# Patient Record
Sex: Female | Born: 1975 | Race: Asian | Hispanic: No | Marital: Married | Smoking: Never smoker
Health system: Southern US, Community
[De-identification: ages and names within clinical notes are randomized; demographics above are authoritative.]

## PROBLEM LIST (undated history)

## (undated) DIAGNOSIS — R000 Tachycardia, unspecified: Secondary | ICD-10-CM

## (undated) DIAGNOSIS — K219 Gastro-esophageal reflux disease without esophagitis: Secondary | ICD-10-CM

## (undated) DIAGNOSIS — F419 Anxiety disorder, unspecified: Secondary | ICD-10-CM

---

## 2010-07-27 ENCOUNTER — Emergency Department (HOSPITAL_COMMUNITY): Admission: EM | Admit: 2010-07-27 | Discharge: 2010-07-27 | Payer: Self-pay | Admitting: Family Medicine

## 2016-11-26 ENCOUNTER — Encounter: Payer: Self-pay | Admitting: Family Medicine

## 2016-11-26 ENCOUNTER — Ambulatory Visit (INDEPENDENT_AMBULATORY_CARE_PROVIDER_SITE_OTHER): Payer: 59 | Admitting: Family Medicine

## 2016-11-26 DIAGNOSIS — G5601 Carpal tunnel syndrome, right upper limb: Secondary | ICD-10-CM | POA: Diagnosis not present

## 2016-11-26 MED ORDER — PREDNISONE 10 MG PO TABS: ORAL_TABLET | ORAL | 0 refills | Status: DC

## 2016-11-26 NOTE — Patient Instructions (Signed)
You have carpal tunnel syndrome. Wear the wrist brace at nighttime and as often as possible during the day Prednisone dose pack - take as directed until finished. Day AFTER finishing this you can take ibuprofen 600mg  three times a day with food OR aleve 2 tabs twice a day with food for pain and inflammation but only if needed. Corticosteroid injection is a consideration to help with pain and inflammation If not improving, will consider nerve conduction studies to assess severity. Follow up with me in 6 weeks.

## 2016-11-28 DIAGNOSIS — G5603 Carpal tunnel syndrome, bilateral upper limbs: Secondary | ICD-10-CM | POA: Insufficient documentation

## 2016-11-28 NOTE — Assessment & Plan Note (Signed)
Provided wrist brace to wear at night and as often as possible during the day.  Repeat prednisone dose pack.  Consider injection, NCV/EMGs if not improving as expected.  F/u in 6 weeks.

## 2016-11-28 NOTE — Progress Notes (Signed)
PCP: Charlette CaffeyParuchuri, Lakshmi P, MD  Subjective:   HPI: Patient is a [age] y.o. female here for right wrist pain.  Patient reports she's had at least a couple weeks, up to a month of pain in right wrist. Worse first thing in the morning at 9/10 level, sharp. Associated numbness into hand and fingers but sparing the thumb. No injury or trauma. No swelling. 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 Using a sleeve for wrist but not brace. No skin changes. Right handed.  No past medical history on file.  No current outpatient prescriptions on file prior to visit.   No current facility-administered medications on file prior to visit.     No past surgical history on file.  No Known Allergies  Social History   Social History  . Marital status: Married    Spouse name: N/A  . Number of children: N/A  . Years of education: N/A   Occupational History  . Not on file.   Social History Main Topics  . Smoking status: Never Smoker  . Smokeless tobacco: Never Used  . Alcohol use Not on file  . Drug use: Unknown  . Sexual activity: Not on file   Other Topics Concern  . Not on file   Social History Narrative  . No narrative on file    No family history on file.  BP 129/87   Pulse 84   Ht 5\' 2"  (1.575 m)   Wt 155 lb (70.3 kg)   BMI 28.35 kg/m   Review of Systems: See HPI above.     Objective:  Physical Exam:  Gen: NAD, comfortable in exam room  Right wrist: No gross deformity, swelling, bruising. Mild TTP carpal tunnel.  No other tenderness. FROM digits and wrists. Strength 5/5 all motions including wrist flexion/extension, finger abduction, extension, thumb opposition. Negative phalens.  Positive tinels mildly. Sensation diminished 2nd-5th digits.   Assessment & Plan:  1. Right hand numbness, wrist pain - consistent with carpal tunnel syndrome.  Provided wrist brace to wear at night and as often as possible during the day.  Repeat  prednisone dose pack.  Consider injection, NCV/EMGs if not improving as expected.  F/u in 6 weeks.

## 2017-04-28 ENCOUNTER — Ambulatory Visit (INDEPENDENT_AMBULATORY_CARE_PROVIDER_SITE_OTHER): Payer: 59 | Admitting: Family Medicine

## 2017-04-28 ENCOUNTER — Encounter: Payer: Self-pay | Admitting: Family Medicine

## 2017-04-28 DIAGNOSIS — G5603 Carpal tunnel syndrome, bilateral upper limbs: Secondary | ICD-10-CM | POA: Diagnosis not present

## 2017-04-28 DIAGNOSIS — M722 Plantar fascial fibromatosis: Secondary | ICD-10-CM | POA: Diagnosis not present

## 2017-04-28 MED ORDER — MELOXICAM 7.5 MG PO TABS: 7.5000 mg | ORAL_TABLET | Freq: Every day | ORAL | 2 refills | Status: DC

## 2017-04-28 NOTE — Patient Instructions (Signed)
You have plantar fasciitis Plantar fascia stretch for 20-30 seconds (do 3 of these) in morning Lowering/raise on a step exercises 3 x 10 once or twice a day - this is very important for long term recovery. Can add heel walks, toe walks forward and backward as well Ice heel for 15 minutes as needed. Avoid flat shoes/barefoot walking as much as possible. Arch straps have been shown to help with pain. Inserts are important (dr. Jari Sportsmanscholls active series, spencos, our green insoles, custom orthotics). Steroid injection is a consideration for short term pain relief if you are struggling. Physical therapy is also an option. Follow up with me in 6 weeks.  You have carpal tunnel syndrome. Wear the wrist brace at nighttime and as often as possible during the day - you can find these at a pharmacy if the ones we gave you are too uncomfortable. Meloxicam 7.5mg  daily with food. Corticosteroid injection is a consideration to help with pain and inflammation. We will go ahead with nerve conduction studies of both arms. I will call you with results and next steps.

## 2017-04-29 DIAGNOSIS — M722 Plantar fascial fibromatosis: Secondary | ICD-10-CM | POA: Insufficient documentation

## 2017-04-29 NOTE — Assessment & Plan Note (Signed)
had mild improvement with prednisone dose pack and wrist brace but seems to have recurred.  We discussed options - she would like to go ahead with NCV/EMGs both upper extremities so will set this up.  Continue using wrist braces, try meloxicam also.

## 2017-04-29 NOTE — Progress Notes (Addendum)
PCP: Charlette CaffeyParuchuri, Lakshmi P, MD  Subjective:   HPI: Patient is a 41 y.o. female here for right wrist pain.  3/8: Patient reports she's had at least a couple weeks, up to a month of pain in right wrist. Worse first thing in the morning at 9/10 level, sharp. Associated numbness into hand and fingers but sparing the thumb. No injury or trauma. No swelling. 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 Using a sleeve for wrist but not brace. No skin changes. Right handed.  8/8: Patient reports he right wrist pain improved following prednisone dose pack and wrist brace but has recurred and is now occurring off and on. Pain 3/10 gets pain into 2nd-4th digits along with numbness. Currently doing well. Worse with typing. Also reports pain plantar left heel for past month. Pain is 3/10, sharp and hard to get going in the morning. Benefit with brooks shoes but not with plantar fascia stocking. No skin changes, numbness.  No past medical history on file.  Current Outpatient Prescriptions on File Prior to Visit  Medication Sig Dispense Refill  . atenolol (TENORMIN) 25 MG tablet Take 25 mg by mouth.     No current facility-administered medications on file prior to visit.     No past surgical history on file.  No Known Allergies  Social History   Social History  . Marital status: Married    Spouse name: N/A  . Number of children: N/A  . Years of education: N/A   Occupational History  . Not on file.   Social History Main Topics  . Smoking status: Never Smoker  . Smokeless tobacco: Never Used  . Alcohol use Not on file  . Drug use: Unknown  . Sexual activity: Not on file   Other Topics Concern  . Not on file   Social History Narrative  . No narrative on file    No family history on file.  BP 104/73   Pulse 80   Ht 5\' 1"  (1.549 m)   Wt 155 lb (70.3 kg)   BMI 29.29 kg/m   Review of Systems: See HPI above.     Objective:  Physical  Exam:  Gen: NAD, comfortable in exam room  Bilateral wrists: No gross deformity, swelling, bruising. No TTP carpal tunnel.  No other tenderness. FROM digits and wrists. Strength 5/5 all motions including wrist flexion/extension, finger abduction, extension, thumb opposition. Negative phalens, tinels Sensation intact to light touch all digits.  Left foot/ankle: No gross deformity, swelling, ecchymoses FROM TTP medial calcaneus at plantar fascia insertion. Negative ant drawer and talar tilt.   Negative syndesmotic compression. Negative calcaneal squeeze. Thompsons test negative. NV intact distally.  Assessment & Plan:  1. Bilateral hand numbness, wrist pain - had mild improvement with prednisone dose pack and wrist brace but seems to have recurred.  We discussed options - she would like to go ahead with NCV/EMGs both upper extremities so will set this up.  Continue using wrist braces, try meloxicam also.  2. Left plantar fasciitis - arch binders, icing, arch supports.  Shown home exercises and stretches to do daily.  Avoid flat shoes, barefoot walking.  F/u in 6 weeks.  Addendum:  NCV/EMGs reviewed and discussed with patient.  She has mild bilateral carpal tunnel - switch to ibuprofen from meloxicam - not getting much benefit with this and not tolerating well.  Continue wrist braces.  F/u in 1 month to 6 weeks.  She reports some bilateral elbow  pain - advised we could see her sooner for this if she'd like.

## 2017-04-29 NOTE — Assessment & Plan Note (Signed)
arch binders, icing, arch supports.  Shown home exercises and stretches to do daily.  Avoid flat shoes, barefoot walking.  F/u in 6 weeks.

## 2017-04-30 NOTE — Addendum Note (Signed)
Addended by: Kathi SimpersWISE, Grant Henkes F on: 04/30/2017 08:24 AM   Modules accepted: Orders

## 2017-05-20 ENCOUNTER — Ambulatory Visit (INDEPENDENT_AMBULATORY_CARE_PROVIDER_SITE_OTHER): Payer: 59 | Admitting: Diagnostic Neuroimaging

## 2017-05-20 ENCOUNTER — Encounter (INDEPENDENT_AMBULATORY_CARE_PROVIDER_SITE_OTHER): Payer: Self-pay

## 2017-05-20 DIAGNOSIS — R200 Anesthesia of skin: Secondary | ICD-10-CM | POA: Diagnosis not present

## 2017-05-20 DIAGNOSIS — Z0289 Encounter for other administrative examinations: Secondary | ICD-10-CM

## 2017-05-21 NOTE — Procedures (Signed)
[facility]  NCS (NERVE CONDUCTION STUDY) WITH EMG (ELECTROMYOGRAPHY) REPORT   STUDY DATE: 05/20/17 PATIENT NAME: Adriana MapleRohini Thompson DOB: 08/04/1976 MRN: [id number]  ORDERING CLINICIAN: Norton BlizzardShane Hudnall, MD   TECHNOLOGIST: Charlesetta IvoryBeau Handy ELECTROMYOGRAPHER: Glenford BayleyVikram R. Momo Braun, MD  CLINICAL INFORMATION: 41 year old female with bilateral hand numbness. Evaluate for carpal tunnel syndrome.  FINDINGS: NERVE CONDUCTION STUDY: Right median motor response has prolonged distal latency, normal amplitude, normal conduction velocity.  Left median motor response has prolonged distal latency, normal amplitude, normal conduction velocity.  Bilateral ulnar motor responses and F wave latencies are normal.  Bilateral median sensory responses have slightly prolonged peak latencies and decreased amplitudes.  Bilateral ulnar sensory responses are normal.    NEEDLE ELECTROMYOGRAPHY: Right deltoid, biceps, triceps, flexor carpi radialis, first dorsal interosseous muscles are normal.   IMPRESSION:  Abnormal study demonstrating: - Mild bilateral median neuropathies at the wrist consistent with mild bilateral carpal tunnel syndrome.      INTERPRETING PHYSICIAN:  Suanne MarkerVIKRAM R. Mayana Irigoyen, MD Certified in Neurology, Neurophysiology and Neuroimaging  [facility] [address], [address] [place], [place] [phone]  [facility]    Nerve / Sites Muscle Latency Ref. Amplitude Ref. Rel Amp Segments Distance Velocity Ref. Area    ms ms mV mV %  cm m/s m/s mVms  L Median - APB     Wrist APB 5.3 ?4.4 4.8 ?4.0 100 Wrist - APB 7   14.7     Upper arm APB 8.3  4.9  100 Upper arm - Wrist 18 59 ?49 15.7  R Median - APB     Wrist APB 4.8 ?4.4 7.8 ?4.0 100 Wrist - APB 7   23.8     Upper arm APB 8.1  7.3  93 Upper arm - Wrist 20 61 ?49 21.4  L Ulnar - ADM     Wrist ADM 2.4 ?3.3 8.3 ?6.0 100 Wrist - ADM 7   21.2     B.Elbow ADM 5.3  7.4  89.6 B.Elbow - Wrist 18 63 ?49 19.5       A.Elbow ADM 7.2  8.2  111 A.Elbow - B.Elbow 12 62 ?49 20.6         A.Elbow - Wrist      R Ulnar - ADM     Wrist ADM 2.3 ?3.3 7.5 ?6.0 100 Wrist - ADM 7   20.9     B.Elbow ADM 5.4  6.6  88.2 B.Elbow - Wrist 18 60 ?49 19.4     A.Elbow ADM 6.9  6.9  104 A.Elbow - B.Elbow 10 64 ?49 20.1         A.Elbow - Wrist                 SNC    Nerve / Sites Rec. Site Peak Lat Ref.  Amp Ref. Segments Distance    ms ms V V  cm  L Median - Orthodromic (Dig II, Mid palm)     Dig II Wrist 3.4 ?3.4 7 ?10 Dig II - Wrist 13  R Median - Orthodromic (Dig II, Mid palm)     Dig II Wrist 3.9 ?3.4 3 ?10 Dig II - Wrist 13  L Ulnar - Orthodromic, (Dig V, Mid palm)     Dig V Wrist 2.3 ?3.1 6 ?5 Dig V - Wrist 11  R Ulnar - Orthodromic, (Dig V, Mid palm)     Dig V Wrist 2.1 ?3.1 8 ?5 Dig V - Wrist 11  F  Wave    Nerve F Lat Ref.   ms ms  L Ulnar - ADM 22.6 ?32.0  R Ulnar - ADM 22.7 ?32.0         EMG full       EMG Summary Table    Spontaneous MUAP Recruitment  Muscle IA Fib PSW Fasc Other Amp Dur. Poly Pattern  R. Deltoid Normal None None None _______ Normal Normal Normal Normal  R. Biceps brachii Normal None None None _______ Normal Normal Normal Normal  R. Triceps brachii Normal None None None _______ Normal Normal Normal Normal  R. Flexor carpi radialis Normal None None None _______ Normal Normal Normal Normal  R. First dorsal interosseous Normal None None None _______ Normal Normal Normal Normal

## 2017-05-25 MED ORDER — IBUPROFEN 800 MG PO TABS: 800.0000 mg | ORAL_TABLET | Freq: Three times a day (TID) | ORAL | 1 refills | Status: DC | PRN

## 2017-05-25 NOTE — Addendum Note (Signed)
Addended by: Lenda KelpHUDNALL, Abdiel Blackerby R on: 05/25/2017 01:27 PM   Modules accepted: Orders

## 2018-10-24 ENCOUNTER — Encounter: Payer: Self-pay | Admitting: Family Medicine

## 2018-10-24 ENCOUNTER — Encounter (INDEPENDENT_AMBULATORY_CARE_PROVIDER_SITE_OTHER): Payer: Self-pay

## 2018-10-24 ENCOUNTER — Ambulatory Visit (INDEPENDENT_AMBULATORY_CARE_PROVIDER_SITE_OTHER): Payer: 59 | Admitting: Family Medicine

## 2018-10-24 VITALS — BP 129/89 | HR 116 | Ht 62.0 in | Wt 158.0 lb

## 2018-10-24 DIAGNOSIS — M7712 Lateral epicondylitis, left elbow: Secondary | ICD-10-CM | POA: Diagnosis not present

## 2018-10-24 DIAGNOSIS — G5603 Carpal tunnel syndrome, bilateral upper limbs: Secondary | ICD-10-CM | POA: Diagnosis not present

## 2018-10-24 DIAGNOSIS — M7711 Lateral epicondylitis, right elbow: Secondary | ICD-10-CM | POA: Diagnosis not present

## 2018-10-24 MED ORDER — METHYLPREDNISOLONE ACETATE 40 MG/ML IJ SUSP
40.0000 mg | Freq: Once | INTRAMUSCULAR | Status: AC
  Administered 2018-10-24: 40 mg via INTRA_ARTICULAR

## 2018-10-24 NOTE — Progress Notes (Signed)
PCP: Truett PernaWang, Yun, MD  Subjective:   HPI: Patient is a 43 y.o. female here for bilateral carpal tunnel and bilateral elbow pain.  She has a history of EMG/NCS confirmed mild b/l carpal tunnel syndrome.  She initially had good relief with wearing nighttime splints but has since discontinued this.  Her pain recently returned.  She began wearing the braces again but with less benefit.  She continues to have symptoms that are worse in the morning and improves throughout the day.  In the morning she notes pain and numbness in the thenar eminence and first 3 fingers.  She denies any significant weakness.  No swelling.  No erythema  She notes bilateral lateral elbow pain for the past 2 months.  She feels like this began after a change in her job and having to lift items numerous times throughout the day.  She localizes the pain around the lateral epicondyle.  No erythema.  No swelling.  History reviewed. No pertinent past medical history.  Current Outpatient Medications on File Prior to Visit  Medication Sig Dispense Refill  . atenolol (TENORMIN) 25 MG tablet Take 25 mg by mouth.    Marland Kitchen. ibuprofen (ADVIL,MOTRIN) 800 MG tablet Take 1 tablet (800 mg total) by mouth every 8 (eight) hours as needed. 90 tablet 1  . meloxicam (MOBIC) 7.5 MG tablet Take 1 tablet (7.5 mg total) by mouth daily. 30 tablet 2   No current facility-administered medications on file prior to visit.     History reviewed. No pertinent surgical history.  No Known Allergies  Social History   Socioeconomic History  . Marital status: Married    Spouse name: Not on file  . Number of children: Not on file  . Years of education: Not on file  . Highest education level: Not on file  Occupational History  . Not on file  Social Needs  . Financial resource strain: Not on file  . Food insecurity:    Worry: Not on file    Inability: Not on file  . Transportation needs:    Medical: Not on file    Non-medical: Not on file  Tobacco Use   . Smoking status: Never Smoker  . Smokeless tobacco: Never Used  Substance and Sexual Activity  . Alcohol use: Not on file  . Drug use: Not on file  . Sexual activity: Not on file  Lifestyle  . Physical activity:    Days per week: Not on file    Minutes per session: Not on file  . Stress: Not on file  Relationships  . Social connections:    Talks on phone: Not on file    Gets together: Not on file    Attends religious service: Not on file    Active member of club or organization: Not on file    Attends meetings of clubs or organizations: Not on file    Relationship status: Not on file  . Intimate partner violence:    Fear of current or ex partner: Not on file    Emotionally abused: Not on file    Physically abused: Not on file    Forced sexual activity: Not on file  Other Topics Concern  . Not on file  Social History Narrative  . Not on file    History reviewed. No pertinent family history.  BP 129/89   Pulse (!) 116   Ht 5\' 2"  (1.575 m)   Wt 158 lb (71.7 kg)   BMI 28.90 kg/m  Review of Systems: See HPI above.     Objective:  Physical Exam:  Gen: awake, alert, NAD, comfortable in exam room Pulm: breathing unlabored  Left hand: Inspection: No obvious deformity. No swelling, erythema or bruising Palpation: no TTP ROM: Full ROM of the digits and wrist Strength: 5/5 strength in the forearm, wrist and interosseus muscles Neurovascular: NV intact Special tests: Negative finkelstein's, negative tinel's at the carpal tunnel, negative Phalen's and reverse Phalen's  Right hand: Inspection: No obvious deformity. No swelling, erythema or bruising Palpation: no TTP ROM: Full ROM of the digits and wrist Strength: 5/5 strength in the forearm, wrist and interosseus muscles Neurovascular: NV intact Special tests: Negative finkelstein's, negative tinel's at the carpal tunnel, negative Phalen's  Bilateral elbows: No obvious deformity or swelling Tenderness  bilaterally over the lateral epicondyle Full range of motion at the wrist and elbow 5/5 strength Resisted ECRB reproduces pain in the left.  This pain is less pronounced on the right.   Assessment & Plan:  1.  Bilateral carpal tunnel - Steroid injection performed today bilaterally - Patient will continue to wear nighttime braces  2.  Bilateral lateral epicondylitis - Instructed on home exercises. -Will refer to physical therapy - f/u 6 weeks  Procedure performed: bilateral carpal tunnel injection, ultrasound-guided Consent obtained and verified. Time-out conducted.  LEFT Carpal Tunnel Noted no overlying erythema, induration, or other signs of local infection. The median nerve was identified with ultrasound. The overlying skin was prepped in a sterile fashion. Topical analgesic spray: Ethyl chloride. Needle: 25ga, 1.5" Completed without difficulty. Meds: depomedrol 40mg , bupivicaine 2cc  RIGHT Carpal Tunnel Noted no overlying erythema, induration, or other signs of local infection. The median nerve was identified with ultrasound. The overlying skin was prepped in a sterile fashion. Topical analgesic spray: Ethyl chloride. Needle: 25ga, 1.5" Completed without difficulty. Meds: depomedrol 40mg , bupivicaine 2cc

## 2018-10-24 NOTE — Patient Instructions (Signed)
You have carpal tunnel syndrome. Wear the wrist brace at nighttime and as often as possible during the day for the next 6 weeks. Ibuprofen or aleve only if needed. Corticosteroid injection is a consideration to help with pain and inflammation - you were given these today. You also have lateral epicondylitis of your elbows - start physical therapy for this in 1 week. Do home exercises on days you don't go to therapy. Counterforce braces for your elbows. Follow up with me in 6 weeks.

## 2018-10-26 ENCOUNTER — Encounter: Payer: Self-pay | Admitting: Family Medicine

## 2018-11-04 ENCOUNTER — Other Ambulatory Visit: Payer: Self-pay

## 2018-11-04 ENCOUNTER — Ambulatory Visit: Payer: 59 | Attending: Family Medicine | Admitting: Physical Therapy

## 2018-11-04 DIAGNOSIS — R600 Localized edema: Secondary | ICD-10-CM | POA: Diagnosis present

## 2018-11-04 DIAGNOSIS — M6281 Muscle weakness (generalized): Secondary | ICD-10-CM

## 2018-11-04 DIAGNOSIS — M25522 Pain in left elbow: Secondary | ICD-10-CM | POA: Diagnosis not present

## 2018-11-04 DIAGNOSIS — M62838 Other muscle spasm: Secondary | ICD-10-CM | POA: Diagnosis present

## 2018-11-04 DIAGNOSIS — M25521 Pain in right elbow: Secondary | ICD-10-CM | POA: Insufficient documentation

## 2018-11-04 NOTE — Therapy (Signed)
[facility] [place] [address]  [address] [place], [place], [address] Phone: [phone]   Fax:  [phone]  Physical Therapy Evaluation  Patient Details  Name: Adriana MapleRohini Thompson MRN: [id number] Date of Birth: Nov 13, 1975 Referring Provider (PT): Norton BlizzardShane Hudnall, MD   Encounter Date: 11/04/2018  PT End of Session - 11/04/18 0802    Visit Number  1    Number of Visits  12    Authorization Type  UHC    Authorization - Number of Visits  60    PT Start Time  0802    PT Stop Time  0849    PT Time Calculation (min)  47 min    Activity Tolerance  Patient tolerated treatment well    Behavior During Therapy  [facility] for tasks assessed/performed       No past medical history on file.  No past surgical history on file.  There were no vitals filed for this visit.   Subjective Assessment - 11/04/18 0805    Subjective  Pt reports h/o carpal tunnel for >1 yr, but now noting pain in B elbows for past few months. 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 Reports pain somewhat better after having received carpal tunnel injections last week as well as having completed a prednisone dose pack. She states she can't take pain meds as they do not agree with her stomach.    Limitations  Lifting;House hold activities    Patient Stated Goals  "to be able to work w/o pain"    Currently in Pain?  Yes    Pain Score  3     Pain Location  Elbow    Pain Orientation  Left;Right   L > R   Pain Descriptors / Indicators  Aching    Pain Type  Acute pain    Pain Radiating Towards  lateral epicondyle into wrist extensor group    Pain Onset  More than a month ago    Pain Frequency  Constant    Aggravating Factors   lifting, work    Pain Relieving Factors  avoiding lifting, heating pad, vibrator massage, prednisone dose pack      Effect of Pain on Daily Activities  interferes with work, pain upon waking in morning         [facility] - 11/04/18 0802      Assessment   Medical Diagnosis  B lateral epicondylitis    Referring Provider (PT)  Norton BlizzardShane Hudnall, MD    Onset Date/Surgical Date  --   6 months   Hand Dominance  Right    Next MD Visit  12/07/18    Prior Therapy  none      Precautions   Required Braces or Orthoses  Other Brace/Splint    Other Brace/Splint  has elbow straps, but does not feel that they help      Home Environment   Living Environment  Private residence      Prior Function   Level of Independence  Independent    Vocation  Full time employment    Naval architectVocation Requirements  Pharmacist    Leisure  shopping, time with family      Cognition   Overall Cognitive Status  Within Functional Limits for tasks assessed      Observation/Other Assessments   Focus on Therapeutic Outcomes (  FOTO)   Elbow - 44% (56% limitation; Predicted 64% (36% limitation)      Sensation   Light Touch  Appears Intact      Coordination   Gross Motor Movements are Fluid and Coordinated  Yes    Fine Motor Movements are Fluid and Coordinated  Yes      ROM / Strength   AROM / PROM / Strength  AROM;Strength      AROM   Overall AROM   Within functional limits for tasks performed    AROM Assessment Site  Shoulder;Elbow;Forearm;Wrist      Strength   Strength Assessment Site  Shoulder;Elbow;Forearm;Wrist;Hand    Right/Left Shoulder  Right;Left    Right Shoulder Flexion  5/5    Right Shoulder Extension  5/5    Right Shoulder ABduction  5/5    Right Shoulder Internal Rotation  5/5    Right Shoulder External Rotation  4/5    Left Shoulder Flexion  5/5    Left Shoulder Extension  5/5    Left Shoulder ABduction  5/5    Left Shoulder Internal Rotation  5/5    Left Shoulder External Rotation  4/5    Right/Left Elbow  Right;Left    Right Elbow Flexion  5/5    Right Elbow Extension  4/5   pain   Left Elbow  Flexion  5/5    Left Elbow Extension  4/5   pain   Right/Left Forearm  Right;Left    Right Forearm Pronation  4+/5    Right Forearm Supination  4+/5    Left Forearm Pronation  4+/5    Left Forearm Supination  4+/5    Right/Left Wrist  Right;Left    Right Wrist Flexion  4/5    Right Wrist Extension  4/5    Right Wrist Radial Deviation  4/5    Right Wrist Ulnar Deviation  4/5    Left Wrist Flexion  4/5    Left Wrist Extension  4/5    Left Wrist Radial Deviation  4/5    Left Wrist Ulnar Deviation  4/5    Right/Left hand  Right;Left    Right Hand Grip (lbs)  32.33   30, 35, 32   Right Hand Lateral Pinch  14 lbs   15, 14, 13   Right Hand 3 Point Pinch  13.67 lbs   12, 15, 14   Left Hand Grip (lbs)  25.67   30, 20, 27   Left Hand Lateral Pinch  11.33 lbs   12, 12, 10   Left Hand 3 Point Pinch  11.68 lbs   13, 11, 11     Palpation   Palpation comment  ttp over B lateral epicondyles with mutiple taut/tender bands in B wrist extensor groups (R>L)                Objective measurements completed on examination: See above findings.      [facility] Adult PT Treatment/Exercise - 11/04/18 0802      Self-Care   Self-Care  Heat/Ice Application    Heat/Ice Application  Instructed pt in ice massage for B lateral epicondylitis.      Exercises   Exercises  Elbow;Wrist      Wrist Exercises   Wrist Extension  Both;Self ROM;AAROM;10 reps;Seated    Wrist Extension Limitations  concentric AA/PROM + eccentric AROM    Other wrist exercises  B wrist extensor & flexor stretches 2 x 30 sec  PT Education - 11/04/18 0849    Education Details  PT eval findings, anticipated POC, initial HEP and ice massage for pain control    Person(s) Educated  Patient    Methods  Explanation;Demonstration;Handout    Comprehension  Verbalized understanding;Returned demonstration       PT Short Term Goals - 11/04/18 0849      PT SHORT TERM GOAL #1   Title  Independent with initial  HEP    Status  New    Target Date  11/18/18        PT Long Term Goals - 11/04/18 0849      PT LONG TERM GOAL #1   Title  Independent with ongoing/advanced HEP    Status  New    Target Date  12/16/18      PT LONG TERM GOAL #2   Title  Patient to demonstrate improved tissue quality and pliability with reduced pain in B wrist extensor groups    Status  New    Target Date  12/16/18      PT LONG TERM GOAL #3   Title  B wrist & elbow strength >/= 4+/5    Status  New    Target Date  12/16/18      PT LONG TERM GOAL #4   Title  Patient will improve B grip strength by >/= 10#    Status  New    Target Date  12/16/18      PT LONG TERM GOAL #5   Title  Patient to report ability to perform ADLs, household and work related tasks without increased pain    Status  New    Target Date  12/16/18             Plan - 11/04/18 0849    Clinical Impression Statement  Adriana FrameRohini is a 43 y/o R handed female who presents to OP PT for B lateral epicondylitis originating >6 months ago. She also has been diagnosed with B carpal tunnel syndrome but reports good relief from recent injections. 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 Pain currently 3/10 (L>R), but pt reports this has improved since taking a prednisone does pack having received carpal tunnel injections. Pt reports she is unable to take any kind of pain medicine as they all irritate her stomach. Pt ttp over B lateral epicondyles and wrist extensor muscle groups (R>L), but denies pain or paresthesia radiating down forearm with resolution of hand symptoms since carpal tunnel injections. B UE AROM WNL but pain reported with lifting and twisting motions. B distal UE strength mildly limited including decreased grip strength. Pt will benefit from skilled PT to address deficits listed to reduce pain and allow for improved functional B UE with daily  chores and work tasks.    Clinical Presentation  Stable    Clinical Decision Making  Low    Rehab Potential  Good    Clinical Impairments Affecting Rehab Potential  job requires chronic repetitive lifting and twisting    PT Frequency  2x / week    PT Duration  6 weeks    PT Treatment/Interventions  Patient/family education;Therapeutic exercise;Therapeutic activities;Manual techniques;Passive range of motion;Dry needling;Taping;Iontophoresis 4mg /ml Dexamethasone;Electrical Stimulation;Moist Heat;Ultrasound;Cryotherapy;ADLs/Self Care Home Management    PT Next Visit Plan  Review initial HEP; Gentle stretching and ROM; Eccentric strengthening; Manual therapy and modalities as indicated    Consulted and Agree with Plan of  Care  Patient       Patient will benefit from skilled therapeutic intervention in order to improve the following deficits and impairments:  Pain, Impaired flexibility, Increased fascial restricitons, Increased muscle spasms, Decreased strength, Impaired UE functional use, Decreased activity tolerance  Visit Diagnosis: Pain in left elbow  Pain in right elbow  Muscle weakness (generalized)  Other muscle spasm  Localized edema     Problem List Patient Active Problem List   Diagnosis Date Noted  . Plantar fasciitis of left foot 04/29/2017  . Bilateral carpal tunnel syndrome 11/28/2016    Adriana Thompson, PT, MPT 11/04/2018, 1:54 PM  [facility] [address]  [address] [place], [place], [address] Phone: [phone]   Fax:  [phone]  Name: Adriana Thompson MRN: [id number] Date of Birth: 1976/04/19

## 2018-11-09 ENCOUNTER — Ambulatory Visit: Payer: 59

## 2018-11-10 ENCOUNTER — Encounter: Payer: Self-pay | Admitting: Physical Therapy

## 2018-11-11 ENCOUNTER — Ambulatory Visit: Payer: 59

## 2018-11-11 DIAGNOSIS — M6281 Muscle weakness (generalized): Secondary | ICD-10-CM

## 2018-11-11 DIAGNOSIS — M25522 Pain in left elbow: Secondary | ICD-10-CM

## 2018-11-11 DIAGNOSIS — M62838 Other muscle spasm: Secondary | ICD-10-CM

## 2018-11-11 DIAGNOSIS — R600 Localized edema: Secondary | ICD-10-CM

## 2018-11-11 DIAGNOSIS — M25521 Pain in right elbow: Secondary | ICD-10-CM

## 2018-11-11 NOTE — Patient Instructions (Signed)

## 2018-11-11 NOTE — Therapy (Signed)
[facility] [address]  [address] [place], [place], [address] Phone: [phone]   Fax:  [phone]  Physical Therapy Treatment  Patient Details  Name: Adriana Thompson MRN: [id number] Date of Birth: Jul 16, 1976 Referring Provider (PT): Norton Blizzard, MD   Encounter Date: 11/11/2018  PT End of Session - 11/11/18 1113    Visit Number  2    Number of Visits  12    Authorization Type  UHC    Authorization - Number of Visits  60    PT Start Time  1104    PT Stop Time  1205    PT Time Calculation (min)  61 min    Activity Tolerance  Patient tolerated treatment well    Behavior During Therapy  [facility] for tasks assessed/performed       No past medical history on file.  No past surgical history on file.  There were no vitals filed for this visit.  Subjective Assessment - 11/11/18 1110    Subjective  Pt. reporting some pain with extensor stretch HEP activity.      Patient Stated Goals  "to be able to work w/o pain"    Currently in Pain?  Yes    Pain Score  2    up to 9/10 on L elbow at times, up to 6/10 at R elbow while lifting     Pain Location  Elbow    Pain Orientation  Left;Right    Pain Descriptors / Indicators  Aching    Pain Type  Acute pain    Pain Radiating Towards  Lateral epidcondyle into wrist extensor group     Pain Onset  More than a month ago    Pain Frequency  Constant    Aggravating Factors   Lifting     Multiple Pain Sites  No                       [facility] Adult PT Treatment/Exercise - 11/11/18 1205      Shoulder Exercises: ROM/Strengthening   UBE (Upper Arm Bike)  Lvl 1.0, forwards/78min backwards       Wrist Exercises   Wrist Extension  Both;Self ROM;AAROM;10 reps;Seated    Bar Weights/Barbell (Wrist Extension)  1 lb   no increased pain with 1# as compared to no resistance    Wrist Extension Limitations  concentric AA/PROM + eccentric AROM     Other wrist exercises  B wrist  extensor & flexor stretches 2 x 30 sec   Pt. noting increased pain with L extensor stretch      Modalities   Modalities  Electrical Stimulation;Cryotherapy      Cryotherapy   Number Minutes Cryotherapy  8 Minutes    Cryotherapy Location  Wrist   B forearm extensor bundle proximal to elbow    Type of Cryotherapy  Ice massage      Electrical Stimulation   Electrical Stimulation Location  B forearm extensors    Electrical Stimulation Action  Premod     Electrical Stimulation Parameters  80-150Hz , intensity to pt. tolerance, 10'    Electrical Stimulation Goals  Pain      Manual Therapy   Manual Therapy  Soft tissue mobilization;Myofascial release    Manual therapy comments  Supine     Soft tissue mobilization  STM/DTM to tolerance to B forearm extensor bundle    Myofascial Release  TPR to B forearm extensors  PT Short Term Goals - 11/11/18 1121      PT SHORT TERM GOAL #1   Title  Independent with initial HEP    Status  On-going    Target Date  11/18/18        PT Long Term Goals - 11/11/18 1227      PT LONG TERM GOAL #1   Title  Independent with ongoing/advanced HEP    Status  On-going      PT LONG TERM GOAL #2   Title  Patient to demonstrate improved tissue quality and pliability with reduced pain in B wrist extensor groups    Status  On-going      PT LONG TERM GOAL #3   Title  B wrist & elbow strength >/= 4+/5    Status  On-going      PT LONG TERM GOAL #4   Title  Patient will improve B grip strength by >/= 10#    Status  On-going      PT LONG TERM GOAL #5   Title  Patient to report ability to perform ADLs, household and work related tasks without increased pain    Status  On-going            Plan - 11/11/18 1222    Clinical Impression Statement  Pt. reporting HEP going well with exception of some increased pain with wrist extensor stretch.  Review of this activity revealed pt. performing with excessive pressure and cued to reduce  stretch pressure with improved tolerance following.  Initiated ice massage to B forearm extensor bundle and manual DTM/TPR to palpable TP bilaterally with good overall tolerance.  Pt. ended visit with B elbow pain thus applied premod E-stim to B forearm with good overall reduction in pain following this.  Initiated gentle eccentric strengthening to wrist extensors which was tolerated well with pt. requiring min cueing for proper technique.  Will monitor response to today's session in coming visit.      Clinical Impairments Affecting Rehab Potential  job requires chronic repetitive lifting and twisting    PT Treatment/Interventions  Patient/family education;Therapeutic exercise;Therapeutic activities;Manual techniques;Passive range of motion;Dry needling;Taping;Iontophoresis 4mg /ml Dexamethasone;Electrical Stimulation;Moist Heat;Ultrasound;Cryotherapy;ADLs/Self Care Home Management    PT Next Visit Plan  Gentle stretching and ROM; Eccentric strengthening; Manual therapy and modalities as indicated    Consulted and Agree with Plan of Care  Patient       Patient will benefit from skilled therapeutic intervention in order to improve the following deficits and impairments:  Pain, Impaired flexibility, Increased fascial restricitons, Increased muscle spasms, Decreased strength, Impaired UE functional use, Decreased activity tolerance  Visit Diagnosis: Pain in left elbow  Pain in right elbow  Muscle weakness (generalized)  Other muscle spasm  Localized edema     Problem List Patient Active Problem List   Diagnosis Date Noted  . Plantar fasciitis of left foot 04/29/2017  . Bilateral carpal tunnel syndrome 11/28/2016    Kermit Balo, PTA 11/11/18 12:37 PM    [facility] [address]  [address] [place], [place], [address] Phone: [phone]   Fax:  [phone]  Name: Adriana Thompson MRN: [id number] Date of Birth: 08/17/1976

## 2018-11-16 ENCOUNTER — Ambulatory Visit: Payer: 59

## 2018-11-18 ENCOUNTER — Ambulatory Visit: Payer: 59

## 2018-11-23 ENCOUNTER — Ambulatory Visit: Payer: 59 | Attending: Family Medicine

## 2018-11-23 DIAGNOSIS — R600 Localized edema: Secondary | ICD-10-CM | POA: Insufficient documentation

## 2018-11-23 DIAGNOSIS — M25521 Pain in right elbow: Secondary | ICD-10-CM | POA: Diagnosis present

## 2018-11-23 DIAGNOSIS — M25522 Pain in left elbow: Secondary | ICD-10-CM | POA: Insufficient documentation

## 2018-11-23 DIAGNOSIS — M62838 Other muscle spasm: Secondary | ICD-10-CM | POA: Insufficient documentation

## 2018-11-23 DIAGNOSIS — M6281 Muscle weakness (generalized): Secondary | ICD-10-CM | POA: Diagnosis present

## 2018-11-23 NOTE — Patient Instructions (Signed)

## 2018-11-23 NOTE — Therapy (Signed)
[facility] [address]  [address] [place], [place], [address] Phone: [phone]   Fax:  [phone]  Physical Therapy Treatment  Patient Details  Name: Adriana Thompson MRN: [id number] Date of Birth: [birth year] Referring Provider (PT): Norton Blizzard, MD   Encounter Date: 11/23/2018  PT End of Session - 11/23/18 1627    Visit Number  3    Number of Visits  12    Authorization Type  UHC    Authorization - Number of Visits  60    PT Start Time  1620    PT Stop Time  1704    PT Time Calculation (min)  44 min    Activity Tolerance  Patient tolerated treatment well    Behavior During Therapy  [facility] for tasks assessed/performed       History reviewed. No pertinent past medical history.  History reviewed. No pertinent surgical history.  There were no vitals filed for this visit.  Subjective Assessment - 11/23/18 1624    Subjective  Pt. reporting she's been able to perform HEP, "50% of the time".  Pt. reporting she is interested in trying iontophoresis patch.      Patient Stated Goals  "to be able to work w/o pain"    Currently in Pain?  Yes    Pain Score  5     Pain Location  Elbow    Pain Orientation  Left    Pain Descriptors / Indicators  Aching    Pain Type  Acute pain    Pain Radiating Towards  Lateral epicondyle into wrist extensor group     Pain Onset  More than a month ago    Pain Frequency  Constant    Aggravating Factors   taking medication off belt at work    Multiple Pain Sites  Yes    Pain Score  2    Pain Location  Elbow    Pain Orientation  Right    Pain Descriptors / Indicators  Aching    Pain Type  Acute pain    Pain Onset  More than a month ago    Pain Frequency  Intermittent    Aggravating Factors   taking medication off belt at work                        [facility] Adult PT Treatment/Exercise - 11/23/18 1641      Shoulder Exercises: ROM/Strengthening   UBE (Upper Arm Bike)  Lvl 1.5,  forwards/54min backwards       Wrist Exercises   Wrist Extension  Both;Self ROM;AAROM;Seated;15 reps    Bar Weights/Barbell (Wrist Extension)  1 lb    Wrist Extension Limitations  Concentric AA/PROM + eccentric AROM     Other wrist exercises  B wrist extensor & flexor stretches 2 x 30 sec      Modalities   Modalities  Iontophoresis      Cryotherapy   Number Minutes Cryotherapy  8 Minutes    Cryotherapy Location  --   B forearm extensor bundle in area of point tenderness   Type of Cryotherapy  Ice massage      Electrical Stimulation   Electrical Stimulation Location  R/L forearm extensors   Performed during ice massage on opposite elbow    Electrical Stimulation Action  IFC    Electrical Stimulation Parameters  80-150 Hz, intensity to pt. tolerance, 8'    Electrical Stimulation Goals  Pain  Iontophoresis   Type of Iontophoresis  Dexamethasone    Location  B forearm extensor bundle/lateral epicondyle     Dose  52mA-min, 1.79mL    Time  4-6 hr wear time; patch #1/6 to B lateral epicondyles       Manual Therapy   Manual Therapy  Soft tissue mobilization    Manual therapy comments  Supine     Soft tissue mobilization  STM to B forearm extensor bundles; pt. ttp throughout              PT Education - 11/23/18 1739    Education Details  Iontophoresis educational handout including precautions/ contraindications, proper wear time; pt. verbalized understanding    Person(s) Educated  Patient    Methods  Explanation;Demonstration;Verbal cues;Handout    Comprehension  Verbalized understanding;Returned demonstration;Verbal cues required;Need further instruction       PT Short Term Goals - 11/11/18 1121      PT SHORT TERM GOAL #1   Title  Independent with initial HEP    Status  On-going    Target Date  11/18/18        PT Long Term Goals - 11/11/18 1227      PT LONG TERM GOAL #1   Title  Independent with ongoing/advanced HEP    Status  On-going      PT LONG  TERM GOAL #2   Title  Patient to demonstrate improved tissue quality and pliability with reduced pain in B wrist extensor groups    Status  On-going      PT LONG TERM GOAL #3   Title  B wrist & elbow strength >/= 4+/5    Status  On-going      PT LONG TERM GOAL #4   Title  Patient will improve B grip strength by >/= 10#    Status  On-going      PT LONG TERM GOAL #5   Title  Patient to report ability to perform ADLs, household and work related tasks without increased pain    Status  On-going            Plan - 11/23/18 1734    Clinical Impression Statement  Pt. reporting work tasks have continued to irritate B elbows and still feeling constant, achy pain L>R today.  Notes she has been able to perform HEP ~ 50% of the time since last session.  Tolerated mild progression in eccentric B wrist extensor strengthening activities well able to demo improved technique.  Performed ice massage to B lateral epicondyle/proximal forearm extensor bundle in area of tenderness with good tolerance.  Pt. noting good overall pain relief following ice massage and gentle eccentric strengthening activities.  Did require cueing for proper positioning with forearm/wrist stretches today however good carryover following cueing.  Pt. reporting she is interested in trying iontophoresis patch to B elbows and MD signed order for ionto thus issued educational handout including iontophoresis precautions/contraindications, and proper wear time with pt. verbalizing understanding.  Ended visit with application of iontophoresis patches to B lateral epicondyles over area of most tenderness.  Will monitor response to ionto patches and continue to progress toward goals in coming visits.      Rehab Potential  Good    Clinical Impairments Affecting Rehab Potential  job requires chronic repetitive lifting and twisting    PT Treatment/Interventions  Patient/family education;Therapeutic exercise;Therapeutic activities;Manual  techniques;Passive range of motion;Dry needling;Taping;Iontophoresis /ml Dexamethasone;Electrical Stimulation;Moist Heat;Ultrasound;Cryotherapy;ADLs/Self Care Home Management    PT Next Visit Plan  Monitor response to ionto patches #1/6 B lateral elbows; Gentle stretching and ROM; Eccentric strengthening; Manual therapy and modalities as indicated    Consulted and Agree with Plan of Care  Patient       Patient will benefit from skilled therapeutic intervention in order to improve the following deficits and impairments:  Pain, Impaired flexibility, Increased fascial restricitons, Increased muscle spasms, Decreased strength, Impaired UE functional use, Decreased activity tolerance  Visit Diagnosis: Pain in left elbow  Pain in right elbow  Muscle weakness (generalized)  Other muscle spasm  Localized edema     Problem List Patient Active Problem List   Diagnosis Date Noted  . Plantar fasciitis of left foot 04/29/2017  . Bilateral carpal tunnel syndrome 11/28/2016    Kermit Balo, PTA 11/23/18 5:55 PM    [facility] [address]  [address] [place], [place], [address] Phone: [phone]   Fax:  [phone]  Name: Adriana Thompson MRN: [id number] Date of Birth: [birth year]

## 2018-11-24 ENCOUNTER — Encounter: Payer: Self-pay | Admitting: Physical Therapy

## 2018-11-24 ENCOUNTER — Ambulatory Visit: Payer: 59 | Admitting: Physical Therapy

## 2018-11-24 DIAGNOSIS — M25522 Pain in left elbow: Secondary | ICD-10-CM

## 2018-11-24 DIAGNOSIS — M25521 Pain in right elbow: Secondary | ICD-10-CM

## 2018-11-24 DIAGNOSIS — M6281 Muscle weakness (generalized): Secondary | ICD-10-CM

## 2018-11-24 DIAGNOSIS — M62838 Other muscle spasm: Secondary | ICD-10-CM

## 2018-11-24 DIAGNOSIS — R600 Localized edema: Secondary | ICD-10-CM

## 2018-11-24 NOTE — Patient Instructions (Signed)

## 2018-11-24 NOTE — Therapy (Signed)
[facility] [address]  [address] [place], [place], [address] Phone: [phone]   Fax:  [phone]  Physical Therapy Treatment  Patient Details  Name: Adriana Thompson MRN: [id number] Date of Birth: [birth year] Referring Provider (PT): Norton Blizzard, MD   Encounter Date: 11/24/2018  PT End of Session - 11/24/18 1108    Visit Number  4    Number of Visits  12    Authorization Type  UHC    Authorization - Number of Visits  60    PT Start Time  1108    PT Stop Time  1152    PT Time Calculation (min)  44 min    Activity Tolerance  Patient tolerated treatment well    Behavior During Therapy  [facility] for tasks assessed/performed       History reviewed. No pertinent past medical history.  History reviewed. No pertinent surgical history.  There were no vitals filed for this visit.  Subjective Assessment - 11/24/18 1113    Subjective  Pt reporting pain worst upon rising in the morning and gets somewhat better as the day progresses. Unsure of benefit from ionto patches.    Limitations  Lifting;House hold activities    Patient Stated Goals  "to be able to work w/o pain"    Currently in Pain?  Yes    Pain Score  5     Pain Location  Elbow    Pain Orientation  Left    Pain Descriptors / Indicators  Aching    Pain Score  3    Pain Location  Elbow    Pain Orientation  Right    Pain Descriptors / Indicators  Aching                       [facility] Adult PT Treatment/Exercise - 11/24/18 1108      Shoulder Exercises: ROM/Strengthening   UBE (Upper Arm Bike)  L2.0 x 6 min (3' fwd/3' back)      Wrist Exercises   Other wrist exercises  B wrist extensor & flexor stretches 2 x 30 sec      Manual Therapy   Manual Therapy  Soft tissue mobilization;Myofascial release;Taping    Manual therapy comments  supine    Soft tissue mobilization  STM to L wrist extensor bundle    Myofascial Release  manual TPR to L wrist extensors;  pin and stretch to L wrist exetnsor bundle    Kinesiotex  Inhibit Muscle      Kinesiotix   Inhibit Muscle   B wrist extensor groups ~30%; B carpal tunnel ~30%       Trigger Point Dry Needling - 11/24/18 1108    Consent Given?  Yes    Education Handout Provided  Yes    Muscles Treated Wrist/Hand  Extensor carpi radialis longus/brevis;Extensor digitorum;Extensor carpi ulnaris   Left   Extensor carpi radialis longus/brevis Response  Twitch response elicited;Palpable increased muscle length    Extensor digitorum Response  Twitch response elicited;Palpable increased muscle length    Extensor carpi ulnaris Response  Twitch response elicited;Palpable increased muscle length           PT Education - 11/24/18 1115    Education Details  Role of DN, expected response to treatment & recommended post-treatment activity    Person(s) Educated  Patient    Methods  Explanation;Handout    Comprehension  Verbalized understanding       PT Short  Term Goals - 11/24/18 1152      PT SHORT TERM GOAL #1   Title  Independent with initial HEP    Status  Achieved    Target Date  11/18/18        PT Long Term Goals - 11/11/18 1227      PT LONG TERM GOAL #1   Title  Independent with ongoing/advanced HEP    Status  On-going      PT LONG TERM GOAL #2   Title  Patient to demonstrate improved tissue quality and pliability with reduced pain in B wrist extensor groups    Status  On-going      PT LONG TERM GOAL #3   Title  B wrist & elbow strength >/= 4+/5    Status  On-going      PT LONG TERM GOAL #4   Title  Patient will improve B grip strength by >/= 10#    Status  On-going      PT LONG TERM GOAL #5   Title  Patient to report ability to perform ADLs, household and work related tasks without increased pain    Status  On-going            Plan - 11/24/18 1152    Clinical Impression Statement  Adriana Thompson expressing interest in trying DN today - targeted L arm as pt reporting this is where  she feels the most pain. Increased muscle tension with taut bands and TPs t/o entire L wrist extensor bundle therefore addressed individual muscles as well as extensor group as a whole with positive twitch response and some reduction in muscle tension. Initial trial of kinesiotape applied to B wrist extensor group to promote further reduction in muscle tension as well as in carpal tunnel pattern at pt request.    Rehab Potential  Good    Clinical Impairments Affecting Rehab Potential  job requires chronic repetitive lifting and twisting    PT Treatment/Interventions  Patient/family education;Therapeutic exercise;Therapeutic activities;Manual techniques;Passive range of motion;Dry needling;Taping;Iontophoresis 4mg /ml Dexamethasone;Electrical Stimulation;Moist Heat;Ultrasound;Cryotherapy;ADLs/Self Care Home Management    PT Next Visit Plan  Assess response to DN & taping; Gentle stretching and ROM; Eccentric strengthening; Manual therapy including DN & taping indicated; Modalities PRN    Consulted and Agree with Plan of Care  Patient       Patient will benefit from skilled therapeutic intervention in order to improve the following deficits and impairments:  Pain, Impaired flexibility, Increased fascial restricitons, Increased muscle spasms, Decreased strength, Impaired UE functional use, Decreased activity tolerance  Visit Diagnosis: No diagnosis found.     Problem List Patient Active Problem List   Diagnosis Date Noted  . Plantar fasciitis of left foot 04/29/2017  . Bilateral carpal tunnel syndrome 11/28/2016    Adriana Thompson, PT, MPT 11/24/2018, 12:19 PM  [facility] [address]  [address] [place], [place], [address] Phone: [phone]   Fax:  [phone]  Name: Adriana Thompson MRN: [id number] Date of Birth: 1976/04/07

## 2018-11-30 ENCOUNTER — Ambulatory Visit: Payer: 59

## 2018-11-30 ENCOUNTER — Other Ambulatory Visit: Payer: Self-pay

## 2018-11-30 DIAGNOSIS — M25521 Pain in right elbow: Secondary | ICD-10-CM

## 2018-11-30 DIAGNOSIS — M6281 Muscle weakness (generalized): Secondary | ICD-10-CM

## 2018-11-30 DIAGNOSIS — M25522 Pain in left elbow: Secondary | ICD-10-CM

## 2018-11-30 DIAGNOSIS — M62838 Other muscle spasm: Secondary | ICD-10-CM

## 2018-11-30 DIAGNOSIS — R600 Localized edema: Secondary | ICD-10-CM

## 2018-11-30 NOTE — Patient Instructions (Signed)

## 2018-11-30 NOTE — Therapy (Addendum)
[facility] [address]  [place], [place], [address] Phone: [phone]   Fax:  [phone]  Physical Therapy Treatment / Discharge Summary  Patient Details  Name: Adriana Thompson MRN: [id number] Date of Birth: 1976/07/10 Referring Provider (PT): Adriana Lemon, MD   Encounter Date: 11/30/2018  PT End of Session - 11/30/18 1625    Visit Number  5    Number of Visits  12    Authorization Type  UHC    Authorization - Number of Visits  60    PT Start Time  1620    PT Stop Time  1713    PT Time Calculation (min)  53 min    Activity Tolerance  Patient tolerated treatment well    Behavior During Therapy  [facility] for tasks assessed/performed       No past medical history on file.  No past surgical history on file.  There were no vitals filed for this visit.  Subjective Assessment - 11/30/18 1624    Subjective  Pt. reporting unsure of taping benefit from previous benefit.      Patient Stated Goals  "to be able to work w/o pain"    Currently in Pain?  Yes    Pain Score  7     Pain Location  Elbow    Pain Orientation  Left    Pain Descriptors / Indicators  Aching    Pain Type  Acute pain    Pain Radiating Towards  lateral epicondyle     Pain Onset  More than a month ago    Pain Frequency  Constant    Multiple Pain Sites  Yes    Pain Score  2    Pain Location  Elbow    Pain Orientation  Right    Pain Descriptors / Indicators  Aching    Pain Type  Acute pain    Pain Onset  More than a month ago    Pain Frequency  Intermittent                       [facility] Adult PT Treatment/Exercise - 11/30/18 1815      Self-Care   Self-Care  Other Self-Care Comments    Other Self-Care Comments   Instruction on use of TENS 7000 2nd edition home TENS unit with demonstration on clinic unit and instruction on proper electrode placement; pt. verblizing she intends to purchased this online       Shoulder Exercises:  ROM/Strengthening   UBE (Upper Arm Bike)  L2.0 x 6 min (3' fwd/3' back)      Wrist Exercises   Wrist Extension  Both;Self ROM;AAROM;Seated;10 reps    Bar Weights/Barbell (Wrist Extension)  2 lbs    Wrist Extension Limitations  concentric AA/PROM + eccentric AROM     Other wrist exercises  B wrist extensor & flexor stretches 2 x 30 sec      Cryotherapy   Number Minutes Cryotherapy  15 Minutes    Cryotherapy Location  Wrist   L wrist    Type of Cryotherapy  Ice pack      Electrical Stimulation   Electrical Stimulation Location  L forearm extensors    Electrical Stimulation Action  IFC    Electrical Stimulation Parameters  to tolerance, 15'    Electrical Stimulation Goals  Pain      Iontophoresis   Type of Iontophoresis  Dexamethasone    Location  L  forearm extensor bundle/lateral epicondyle     Dose  77m-min, 1.068m   Time  4-6 hr wear time; patch #2/6 to L lateral epicondyles       Manual Therapy   Manual Therapy  Soft tissue mobilization;Myofascial release    Manual therapy comments  supine    Soft tissue mobilization  STM to L wrist extensor bundle    Myofascial Release  manual TPR to L wrist extensors; pin and stretch to L wrist exetnsor bundle             PT Education - 11/30/18 1825    Education Details  Issued handout for Home TENS unit 7000 2nd edition     Person(s) Educated  Patient    Methods  Explanation;Demonstration;Verbal cues;Handout    Comprehension  Verbalized understanding;Returned demonstration;Verbal cues required;Need further instruction       PT Short Term Goals - 11/24/18 1152      PT SHORT TERM GOAL #1   Title  Independent with initial HEP    Status  Achieved    Target Date  11/18/18        PT Long Term Goals - 11/11/18 1227      PT LONG TERM GOAL #1   Title  Independent with ongoing/advanced HEP    Status  On-going      PT LONG TERM GOAL #2   Title  Patient to demonstrate improved tissue quality and pliability with reduced pain  in B wrist extensor groups    Status  On-going      PT LONG TERM GOAL #3   Title  B wrist & elbow strength >/= 4+/5    Status  On-going      PT LONG TERM GOAL #4   Title  Patient will improve B grip strength by >/= 10#    Status  On-going      PT LONG TERM GOAL #5   Title  Patient to report ability to perform ADLs, household and work related tasks without increased pain    Status  On-going            Plan - 11/30/18 1628    Clinical Impression Statement  Pt. primary concern today is L elbow pain reporting R elbow improvement.  Does not feel taping made significant pain improvement, however does note some improvement with ionto patch thus ended visit with application of ionto patch #2/6 to L elbow.  Session focusing on eccentric wrist extensor strengthening and instruction on home TENS unit as pt. with plans to purchase this.  Ended visit with ice pack/E-stim to L elbow to reduce post exercise soreness and swelling.  Pt. reporting she continues to feels that work related tasks irritate L elbow and discussed strategies at work to use R UE more for hopeful reduction in strain.  Will continue to progress toward goals.      Rehab Potential  Good    Clinical Impairments Affecting Rehab Potential  job requires chronic repetitive lifting and twisting    PT Treatment/Interventions  Patient/family education;Therapeutic exercise;Therapeutic activities;Manual techniques;Passive range of motion;Dry needling;Taping;Iontophoresis '4mg'$ /ml Dexamethasone;Electrical Stimulation;Moist Heat;Ultrasound;Cryotherapy;ADLs/Self Care Home Management    PT Next Visit Plan  Gentle stretching and ROM; Eccentric strengthening; Manual therapy including DN & taping indicated; Modalities PRN    Consulted and Agree with Plan of Care  Patient       Patient will benefit from skilled therapeutic intervention in order to improve the following deficits and impairments:  Pain, Impaired flexibility, Increased fascial  restricitons, Increased muscle spasms, Decreased strength, Impaired UE functional use, Decreased activity tolerance  Visit Diagnosis: Pain in left elbow  Pain in right elbow  Muscle weakness (generalized)  Other muscle spasm  Localized edema     Problem List Patient Active Problem List   Diagnosis Date Noted  . Plantar fasciitis of left foot 04/29/2017  . Bilateral carpal tunnel syndrome 11/28/2016    Adriana Thompson, Adriana Thompson 11/30/18 6:26 PM   [facility] [address]  [place], [place], [address] Phone: [phone]   Fax:  [phone]  Name: Adriana Thompson MRN: [id number] Date of Birth: 1976-04-10    PHYSICAL THERAPY DISCHARGE SUMMARY  Visits from Start of Care: 5  Current functional level related to goals / functional outcomes:   Refer to above clinical impression for status as of last visit on 11/30/2018. Patient was placed on hold due to work schedule secondary to COVID-19 not allowing her to return to PT and has been able to return to PT in >3 months, therefore will proceed with discharge from PT for this episode.   Remaining deficits:   As above. Unable to formally assess status at discharge due to inability to return to PT.   Education / Equipment:   HEP  Plan: Patient agrees to discharge.  Patient goals were partially met. Patient is being discharged due to not returning since the last visit.  ?????     Adriana Thompson, PT, MPT 03/17/19, 9:53 AM  [facility] [address]  [place], [place], [address] Phone: [phone]   Fax:  [phone]

## 2018-12-06 ENCOUNTER — Ambulatory Visit: Payer: 59 | Admitting: Physical Therapy

## 2018-12-07 ENCOUNTER — Ambulatory Visit: Payer: 59 | Admitting: Family Medicine

## 2018-12-19 ENCOUNTER — Telehealth: Payer: 59 | Admitting: Family

## 2018-12-19 DIAGNOSIS — R059 Cough, unspecified: Secondary | ICD-10-CM

## 2018-12-19 DIAGNOSIS — R05 Cough: Secondary | ICD-10-CM

## 2018-12-19 NOTE — Progress Notes (Signed)

## 2018-12-20 MED ORDER — NITROGLYCERIN 0.2 MG/HR TD PT24: MEDICATED_PATCH | TRANSDERMAL | 1 refills | Status: AC

## 2018-12-20 MED ORDER — DICLOFENAC SODIUM 75 MG PO TBEC: 75.0000 mg | DELAYED_RELEASE_TABLET | Freq: Two times a day (BID) | ORAL | 1 refills | Status: DC

## 2018-12-20 NOTE — Addendum Note (Signed)
Addended by: Lenda Kelp on: 12/20/2018 03:15 PM   Modules accepted: Orders

## 2019-01-12 ENCOUNTER — Encounter: Payer: Self-pay | Admitting: Physical Therapy

## 2019-04-20 ENCOUNTER — Other Ambulatory Visit: Payer: Self-pay

## 2019-04-20 ENCOUNTER — Ambulatory Visit (INDEPENDENT_AMBULATORY_CARE_PROVIDER_SITE_OTHER): Payer: 59 | Admitting: Family Medicine

## 2019-04-20 ENCOUNTER — Ambulatory Visit: Payer: Self-pay

## 2019-04-20 VITALS — BP 105/78 | HR 78 | Ht 62.0 in | Wt 160.0 lb

## 2019-04-20 DIAGNOSIS — G5603 Carpal tunnel syndrome, bilateral upper limbs: Secondary | ICD-10-CM

## 2019-04-20 MED ORDER — METHYLPREDNISOLONE ACETATE 40 MG/ML IJ SUSP
40.0000 mg | Freq: Once | INTRAMUSCULAR | Status: AC
  Administered 2019-04-20: 40 mg via INTRA_ARTICULAR

## 2019-04-20 MED ORDER — PENNSAID 2 % TD SOLN: 1.0000 "application " | Freq: Two times a day (BID) | TRANSDERMAL | 3 refills | Status: DC

## 2019-04-20 MED ORDER — MELOXICAM 15 MG PO TABS: ORAL_TABLET | ORAL | 1 refills | Status: DC

## 2019-04-20 NOTE — Progress Notes (Signed)
Adriana Thompson - 43 y.o. female MRN [id number]  Date of birth: [birth year]  SUBJECTIVE:  Including CC & ROS.  Chief Complaint  Patient presents with  . Wrist Pain    bilateral wrist    Adriana Thompson is a 43 y.o. female that is presenting with altered sensation and pain in the right and left hand.  This is occurring on the palmar aspect.  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  She has tried medications and physical therapy in the past.  Has had an EMG done as well.  Feels like the symptoms got worse over the past few weeks.  They are severe in the right hand and moderate in the left hand.  They are also worse in the morning.  She is tried splints with limited improvement.  Denies any injury or inciting event.  No prior surgery on the arms or wrist.  Review of the neurology EMG from 05/20/2017 shows mild bilateral neural median neuropathies consistent with carpal tunnel.   Review of Systems  Constitutional: Negative for fever.  HENT: Negative for congestion.   Respiratory: Negative for cough.   Cardiovascular: Negative for chest pain.  Gastrointestinal: Negative for abdominal pain.  Musculoskeletal: Negative for gait problem.  Skin: Negative for color change.  Neurological: Positive for numbness.  Hematological: Negative for adenopathy.    HISTORY: Past Medical, Surgical, Social, and Family History Reviewed & Updated per EMR.   Pertinent Historical Findings include:  No past medical history on file.  No past surgical history on file.  No Known Allergies  No family history on file.   Social History   Socioeconomic History  . Marital status: Married    Spouse name: Not on file  . Number of children: Not on file  . Years of education: Not on file  . Highest education level: Not on file  Occupational History  . Not on file  Social Needs  . Financial resource strain: Not on file  . Food insecurity    Worry: Not on file   Inability: Not on file  . Transportation needs    Medical: Not on file    Non-medical: Not on file  Tobacco Use  . Smoking status: Never Smoker  . Smokeless tobacco: Never Used  Substance and Sexual Activity  . Alcohol use: Not on file  . Drug use: Not on file  . Sexual activity: Not on file  Lifestyle  . Physical activity    Days per week: Not on file    Minutes per session: Not on file  . Stress: Not on file  Relationships  . Social Herbalist on phone: Not on file    Gets together: Not on file    Attends religious service: Not on file    Active member of club or organization: Not on file    Attends meetings of clubs or organizations: Not on file    Relationship status: Not on file  . Intimate partner violence    Fear of current or ex partner: Not on file    Emotionally abused: Not on file    Physically abused: Not on file    Forced sexual activity: Not on file  Other Topics Concern  . Not on file  Social History Narrative  . Not on file     PHYSICAL EXAM:  VS: BP 105/78   Pulse 78   Ht 5\' 2"  (1.575 m)   Wt  160 lb (72.6 kg)   BMI 29.26 kg/m  Physical Exam Gen: NAD, alert, cooperative with exam, well-appearing ENT: normal lips, normal nasal mucosa,  Eye: normal EOM, normal conjunctiva and lids CV:  no edema, +2 pedal pulses   Resp: no accessory muscle use, non-labored,  Skin: no rashes, no areas of induration  Neuro: normal tone, normal sensation to touch Psych:  normal insight, alert and oriented MSK:  Left and right hand: No swelling or ecchymosis. No signs of atrophy. Normal wrist range of motion. Normal grip strength. Normal pincer grasp. Normal strength resistance with finger abduction and abduction. Negative Tinel's at the wrist. Neurovascularly intact  Limited ultrasound: Right wrist:  Median nerve measures 0.7 cm  Summary: No significant enlargement of the median nerve in the carpal tunnel.  Ultrasound and interpretation by  Adriana Thompson , MD   Aspiration/Injection Procedure Note Adriana Thompson [place]vin Sep 29, 1975  Procedure: Injection Indications: Right carpal tunnel syndrome  Procedure Details Consent: Risks of procedure as well as the alternatives and risks of each were explained to the (patient/caregiver).  Consent for procedure obtained. Time Out: Verified patient identification, verified procedure, site/side was marked, verified correct patient position, special equipment/implants available, medications/allergies/relevent history reviewed, required imaging and test results available.  Performed.  The area was cleaned with iodine and alcohol swabs.    The right carpal tunnel was injected using 1 cc's of 40 mg Depo-Medrol and 1 cc's of 0.25% bupivacaine with a 25 1 1/2" needle.  Ultrasound was used. Images were obtained in long views showing the injection.     A sterile dressing was applied.  Patient did tolerate procedure well.     Aspiration/Injection Procedure Note Adriana Thompson Sep 29, 1975  Procedure: Injection Indications: Left carpal tunnel syndrome  Procedure Details Consent: Risks of procedure as well as the alternatives and risks of each were explained to the (patient/caregiver).  Consent for procedure obtained. Time Out: Verified patient identification, verified procedure, site/side was marked, verified correct patient position, special equipment/implants available, medications/allergies/relevent history reviewed, required imaging and test results available.  Performed.  The area was cleaned with iodine and alcohol swabs.    The left carpal tunnel was injected using 1 cc's of 40 mg Depo-Medrol and 1 cc's of 0.25% bupivacaine with a 25 1 1/2" needle.  Ultrasound was used. Images were obtained in long views showing the injection.     A sterile dressing was applied.  Patient did tolerate procedure well.      ASSESSMENT & PLAN:   Bilateral carpal tunnel syndrome Acute on chronic worsening  bilateral carpal tunnel.  Right is worse than left.  EMG from 2018 showed mild symptoms. -Bilateral injections today. -Refilled meloxicam. -Pennsaid. -Counseled on home exercise therapy and supportive care. -Referral to Ortho Dr. Janee Mornhompson.

## 2019-04-20 NOTE — Patient Instructions (Addendum)
Nice to meet you I have made a referral and they will call you to schedule Please try to stretch your wrists during the course of your work  Please send me a message in [place] with any questions or updates.  Please see me back in 4-6 weeks if surgery is delayed.   --Dr. Raeford Razor

## 2019-04-20 NOTE — Assessment & Plan Note (Signed)
Acute on chronic worsening bilateral carpal tunnel.  Right is worse than left.  EMG from 2018 showed mild symptoms. -Bilateral injections today. -Refilled meloxicam. -Pennsaid. -Counseled on home exercise therapy and supportive care. -Referral to Ortho Dr. Grandville Silos.

## 2019-05-12 ENCOUNTER — Encounter: Payer: Self-pay | Admitting: Family Medicine

## 2019-05-12 ENCOUNTER — Other Ambulatory Visit: Payer: Self-pay

## 2019-05-12 ENCOUNTER — Ambulatory Visit (INDEPENDENT_AMBULATORY_CARE_PROVIDER_SITE_OTHER): Payer: 59 | Admitting: Family Medicine

## 2019-05-12 ENCOUNTER — Ambulatory Visit: Payer: Self-pay

## 2019-05-12 VITALS — Ht 62.0 in | Wt 160.0 lb

## 2019-05-12 DIAGNOSIS — S93502A Unspecified sprain of left great toe, initial encounter: Secondary | ICD-10-CM

## 2019-05-12 DIAGNOSIS — M79672 Pain in left foot: Secondary | ICD-10-CM

## 2019-05-12 NOTE — Assessment & Plan Note (Signed)
Joint seems to be affected area.  - post op shoe  - counseled on supportive care - provided work note  - f/u in 2 weeks.  - may need to consider imaging.

## 2019-05-12 NOTE — Patient Instructions (Signed)
Good to see you Please try to ice the toe  Please use the post op shoe while walking around.   Please send me a message in MyChart with any questions or updates.  Please see [place] back in 2 weeks to re-evaluate  the area.   --Dr. Raeford Razor

## 2019-05-12 NOTE — Progress Notes (Signed)
Adriana Thompson - 43 y.o. female MRN [id number]  Date of birth: 03-Jan-1976  SUBJECTIVE:  Including CC & ROS.  Chief Complaint  Patient presents with  . Foot Injury    left foot    Adriana Thompson is a 43 y.o. female that is presenting with left foot pain.  She was rushing this morning and had injury to her left foot.  She is having pain over the dorsal midfoot as well as the first MTP joint.  She is having some swelling in this area.  She has taken ibuprofen with improvement of her symptoms.  It hurts if she ambulates.  The pain is occurring on the dorsal component.  It is localized to this area.  Unsure clear of the mechanism of how she hurt her foot.  Pain is sharp and constant.    Review of Systems  Constitutional: Negative for fever.  HENT: Negative for congestion.   Respiratory: Negative for cough.   Cardiovascular: Negative for chest pain.  Gastrointestinal: Negative for abdominal pain.  Musculoskeletal: Positive for gait problem.  Skin: Negative for color change.  Neurological: Negative for weakness.  Hematological: Negative for adenopathy.    HISTORY: Past Medical, Surgical, Social, and Family History Reviewed & Updated per EMR.   Pertinent Historical Findings include:  No past medical history on file.  No past surgical history on file.  No Known Allergies  No family history on file.   Social History   Socioeconomic History  . Marital status: Married    Spouse name: Not on file  . Number of children: Not on file  . Years of education: Not on file  . Highest education level: Not on file  Occupational History  . Not on file  Social Needs  . Financial resource strain: Not on file  . Food insecurity    Worry: Not on file    Inability: Not on file  . Transportation needs    Medical: Not on file    Non-medical: Not on file  Tobacco Use  . Smoking status: Never Smoker  . Smokeless tobacco: Never Used  Substance and Sexual Activity  . Alcohol use: Not on file   . Drug use: Not on file  . Sexual activity: Not on file  Lifestyle  . Physical activity    Days per week: Not on file    Minutes per session: Not on file  . Stress: Not on file  Relationships  . Social Herbalist on phone: Not on file    Gets together: Not on file    Attends religious service: Not on file    Active member of club or organization: Not on file    Attends meetings of clubs or organizations: Not on file    Relationship status: Not on file  . Intimate partner violence    Fear of current or ex partner: Not on file    Emotionally abused: Not on file    Physically abused: Not on file    Forced sexual activity: Not on file  Other Topics Concern  . Not on file  Social History Narrative  . Not on file     PHYSICAL EXAM:  VS: Ht 5\' 2"  (1.575 m)   Wt 160 lb (72.6 kg)   BMI 29.26 kg/m  Physical Exam Gen: NAD, alert, cooperative with exam, well-appearing ENT: normal lips, normal nasal mucosa,  Eye: normal EOM, normal conjunctiva and lids CV:  no edema, +2 pedal pulses   Resp: no  accessory muscle use, non-labored,  Skin: no rashes, no areas of induration  Neuro: normal tone, normal sensation to touch Psych:  normal insight, alert and oriented MSK:  Left foot: Mild swelling of the dorsal midfoot. Tender to palpation over the first MTP joint. Pain with passive flexion and extension of the first MTP joint. No ecchymosis. Neurovascular intact  Limited ultrasound: Left foot:  There appears to be an effusion of the first MTP joint when compared to the contralateral side. No significant effusion of the tarsometatarsal joints. There does not appear to be an injury to the Lisfranc joint  Summary: Findings suggest sprain to the first MTP joint.  Ultrasound and interpretation by Clare GandyJeremy Paighton Godette, MD      ASSESSMENT & PLAN:   Sprain of great toe of left foot Joint seems to be affected area.  - post op shoe  - counseled on supportive care - provided  work note  - f/u in 2 weeks.  - may need to consider imaging.

## 2019-05-25 ENCOUNTER — Ambulatory Visit: Payer: 59 | Admitting: Family Medicine

## 2019-05-25 NOTE — Progress Notes (Deleted)
  Adriana Thompson - 43 y.o. female MRN [id number]  Date of birth: April 24, 1976  SUBJECTIVE:  Including CC & ROS.  No chief complaint on file.   Adriana Thompson is a 43 y.o. female that is  ***.  ***   Review of Systems  HISTORY: Past Medical, Surgical, Social, and Family History Reviewed & Updated per EMR.   Pertinent Historical Findings include:  No past medical history on file.  No past surgical history on file.  No Known Allergies  No family history on file.   Social History   Socioeconomic History  . Marital status: Married    Spouse name: Not on file  . Number of children: Not on file  . Years of education: Not on file  . Highest education level: Not on file  Occupational History  . Not on file  Social Needs  . Financial resource strain: Not on file  . Food insecurity    Worry: Not on file    Inability: Not on file  . Transportation needs    Medical: Not on file    Non-medical: Not on file  Tobacco Use  . Smoking status: Never Smoker  . Smokeless tobacco: Never Used  Substance and Sexual Activity  . Alcohol use: Not on file  . Drug use: Not on file  . Sexual activity: Not on file  Lifestyle  . Physical activity    Days per week: Not on file    Minutes per session: Not on file  . Stress: Not on file  Relationships  . Social Herbalist on phone: Not on file    Gets together: Not on file    Attends religious service: Not on file    Active member of club or organization: Not on file    Attends meetings of clubs or organizations: Not on file    Relationship status: Not on file  . Intimate partner violence    Fear of current or ex partner: Not on file    Emotionally abused: Not on file    Physically abused: Not on file    Forced sexual activity: Not on file  Other Topics Concern  . Not on file  Social History Narrative  . Not on file     PHYSICAL EXAM:  VS: There were no vitals taken for this visit. Physical Exam Gen: NAD, alert,  cooperative with exam, well-appearing ENT: normal lips, normal nasal mucosa,  Eye: normal EOM, normal conjunctiva and lids CV:  no edema, +2 pedal pulses   Resp: no accessory muscle use, non-labored,  GI: no masses or tenderness, no hernia  Skin: no rashes, no areas of induration  Neuro: normal tone, normal sensation to touch Psych:  normal insight, alert and oriented MSK:  ***      ASSESSMENT & PLAN:   No problem-specific Assessment & Plan notes found for this encounter.

## 2019-12-29 ENCOUNTER — Other Ambulatory Visit: Payer: Self-pay | Admitting: Family Medicine

## 2020-06-19 ENCOUNTER — Emergency Department (HOSPITAL_BASED_OUTPATIENT_CLINIC_OR_DEPARTMENT_OTHER): Payer: No Typology Code available for payment source

## 2020-06-19 ENCOUNTER — Other Ambulatory Visit: Payer: Self-pay

## 2020-06-19 ENCOUNTER — Emergency Department (HOSPITAL_BASED_OUTPATIENT_CLINIC_OR_DEPARTMENT_OTHER)
Admission: EM | Admit: 2020-06-19 | Discharge: 2020-06-19 | Disposition: A | Discharge status: Home / Self Care | Payer: No Typology Code available for payment source | Attending: Emergency Medicine | Admitting: Emergency Medicine

## 2020-06-19 ENCOUNTER — Encounter (HOSPITAL_BASED_OUTPATIENT_CLINIC_OR_DEPARTMENT_OTHER): Payer: Self-pay | Admitting: *Deleted

## 2020-06-19 DIAGNOSIS — F419 Anxiety disorder, unspecified: Secondary | ICD-10-CM | POA: Insufficient documentation

## 2020-06-19 DIAGNOSIS — R0789 Other chest pain: Secondary | ICD-10-CM | POA: Diagnosis present

## 2020-06-19 DIAGNOSIS — R000 Tachycardia, unspecified: Secondary | ICD-10-CM

## 2020-06-19 DIAGNOSIS — R42 Dizziness and giddiness: Secondary | ICD-10-CM | POA: Diagnosis not present

## 2020-06-19 HISTORY — DX: Gastro-esophageal reflux disease without esophagitis: K21.9

## 2020-06-19 HISTORY — DX: Tachycardia, unspecified: R00.0

## 2020-06-19 HISTORY — DX: Anxiety disorder, unspecified: F41.9

## 2020-06-19 LAB — COMPREHENSIVE METABOLIC PANEL
ALT: 23 U/L (ref 0–44)
AST: 20 U/L (ref 15–41)
Albumin: 4.5 g/dL (ref 3.5–5.0)
Alkaline Phosphatase: 62 U/L (ref 38–126)
Anion gap: 11 (ref 5–15)
BUN: 10 mg/dL (ref 6–20)
CO2: 25 mmol/L (ref 22–32)
Calcium: 9.5 mg/dL (ref 8.9–10.3)
Chloride: 102 mmol/L (ref 98–111)
Creatinine, Ser: 0.78 mg/dL (ref 0.44–1.00)
GFR calc Af Amer: >60 mL/min (ref >60)
GFR calc non Af Amer: >60 mL/min (ref >60)
Glucose, Bld: 140 mg/dL — ABNORMAL HIGH (ref 70–99)
Potassium: 4.1 mmol/L (ref 3.5–5.1)
Sodium: 138 mmol/L (ref 135–145)
Total Bilirubin: 0.7 mg/dL (ref 0.3–1.2)
Total Protein: 7.7 g/dL (ref 6.5–8.1)

## 2020-06-19 LAB — CBC WITH DIFFERENTIAL/PLATELET
Abs Immature Granulocytes: 0.07 10*3/uL (ref 0.00–0.07)
Basophils Absolute: 0.1 10*3/uL (ref 0.0–0.1)
Basophils Relative: 1 %
Eosinophils Absolute: 0.4 10*3/uL (ref 0.0–0.5)
Eosinophils Relative: 2 %
HCT: 46.8 % — ABNORMAL HIGH (ref 36.0–46.0)
Hemoglobin: 15 g/dL (ref 12.0–15.0)
Immature Granulocytes: 0 %
Lymphocytes Relative: 21 %
Lymphs Abs: 3.3 10*3/uL (ref 0.7–4.0)
MCH: 27.5 pg (ref 26.0–34.0)
MCHC: 32.1 g/dL (ref 30.0–36.0)
MCV: 85.7 fL (ref 80.0–100.0)
Monocytes Absolute: 1 10*3/uL (ref 0.1–1.0)
Monocytes Relative: 6 %
Neutro Abs: 10.9 10*3/uL — ABNORMAL HIGH (ref 1.7–7.7)
Neutrophils Relative %: 70 %
Platelets: 283 10*3/uL (ref 150–400)
RBC: 5.46 MIL/uL — ABNORMAL HIGH (ref 3.87–5.11)
RDW: 12.2 % (ref 11.5–15.5)
WBC: 15.7 10*3/uL — ABNORMAL HIGH (ref 4.0–10.5)
nRBC: 0 % (ref 0.0–0.2)

## 2020-06-19 LAB — TROPONIN I (HIGH SENSITIVITY): Troponin I (High Sensitivity): <2 ng/L (ref <18)

## 2020-06-19 MED ORDER — HYDROXYZINE HCL 25 MG PO TABS
25.0000 mg | ORAL_TABLET | Freq: Once | ORAL | Status: AC
  Administered 2020-06-19: 25 mg via ORAL
  Filled 2020-06-19: qty 1

## 2020-06-19 MED ORDER — HYDROXYZINE HCL 25 MG PO TABS: 25.0000 mg | ORAL_TABLET | Freq: Two times a day (BID) | ORAL | 0 refills | Status: AC | PRN

## 2020-06-19 MED ORDER — ATENOLOL 50 MG PO TABS: 50.0000 mg | ORAL_TABLET | Freq: Two times a day (BID) | ORAL | 0 refills | Status: AC

## 2020-06-19 MED ORDER — ATENOLOL 25 MG PO TABS: 25.0000 mg | ORAL_TABLET | Freq: Once | ORAL | Status: DC

## 2020-06-19 NOTE — ED Provider Notes (Signed)
[facility] EMERGENCY DEPARTMENT Provider Note   CSN: [id number] Arrival date & time: 06/19/20  1816     History Chief Complaint  Patient presents with   Chest Pain    Adriana Thompson is a 44 y.o. female history anxiety and now sinus tachycardia who presented with dizziness and anxiety.  She is working as a Teacher, early years/pre.  She states that she is under a lot of stress recently.  She had a physical about a week ago and is currently on atenolol.  Patient states that she is very anxious and very stressed out at work.  She checked her blood pressure yesterday and was 150/100 and her heart rate was in the 120s.  She saw her doctor and was prescribed Cymbalta but states that it got her heart rate even faster.  Today she feels lightheaded and dizzy when she stands up.  She then came to the ED for evaluation.  Patient adamantly denies any recent travel or history of blood clots.  She denies actual passing out or weakness or numbness or trouble speaking.  She has well-documented sinus tachycardia and had extensive evaluation in the past.  She states that she had a Holter monitor and was put on atenolol for tachycardia.    The history is provided by the patient.       Past Medical History:  Diagnosis Date   Anxiety    GERD (gastroesophageal reflux disease)    Tachycardia     Patient Active Problem List   Diagnosis Date Noted   Sprain of great toe of left foot 05/12/2019   Plantar fasciitis of left foot 04/29/2017   Bilateral carpal tunnel syndrome 11/28/2016    Past Surgical History:  Procedure Laterality Date   CESAREAN SECTION       OB History   No obstetric history on file.     No family history on file.  Social History   Tobacco Use   Smoking status: Never Smoker   Smokeless tobacco: Never Used  Substance Use Topics   Alcohol use: Not Currently   Drug use: Not Currently    Home Medications Prior to Admission medications   Medication Sig Start  Date End Date Taking? Authorizing Provider  atenolol (TENORMIN) 50 MG tablet Take 50 mg by mouth daily.    [provider]  diclofenac (VOLTAREN) 75 MG EC tablet Take 1 tablet (75 mg total) by mouth 2 (two) times daily. 12/20/18   Hudnall, Azucena Fallen, MD  Diclofenac Sodium (PENNSAID) 2 % SOLN Place 1 application onto the skin 2 (two) times daily. 04/20/19   Myra Rude, MD  escitalopram (LEXAPRO) 10 MG tablet  09/20/18   [provider]  meloxicam (MOBIC) 15 MG tablet One tab PO qAM with breakfast for 2 weeks, then daily prn pain. 04/20/19   Myra Rude, MD  nitroGLYCERIN (NITRODUR - DOSED IN MG/24 HR) 0.2 mg/hr patch Apply 1/4th patch to affected elbow, change daily 12/20/18   Hudnall, Azucena Fallen, MD    Allergies    Patient has no known allergies.  Review of Systems   Review of Systems  Cardiovascular: Positive for chest pain.  Neurological: Positive for dizziness.  All other systems reviewed and are negative.   Physical Exam Updated Vital Signs BP (!) 146/101    Pulse (!) 123    Temp 98.1 F (36.7 C) (Oral)    Resp 18    Ht 5\' 2"  (1.575 m)    Wt 70.3 kg  LMP 05/17/2020    SpO2 100%    BMI 28.35 kg/m   Physical Exam Vitals and nursing note reviewed.  Constitutional:      Comments: Anxious   HENT:     Head: Normocephalic.  Cardiovascular:     Rate and Rhythm: Regular rhythm. Tachycardia present.     Heart sounds: Normal heart sounds.  Pulmonary:     Effort: Pulmonary effort is normal.     Breath sounds: Normal breath sounds.  Abdominal:     General: Bowel sounds are normal.     Palpations: Abdomen is soft.  Musculoskeletal:        General: Normal range of motion.     Cervical back: Normal range of motion.     Right lower leg: No edema.     Left lower leg: No tenderness.  Skin:    General: Skin is warm.     Capillary Refill: Capillary refill takes less than 2 seconds.  Neurological:     General: No focal deficit present.     Mental Status: She is  alert and oriented to person, place, and time.  Psychiatric:        Mood and Affect: Mood is anxious.        Behavior: Behavior normal.     ED Results / Procedures / Treatments   Labs (all labs ordered are listed, but only abnormal results are displayed) Labs Reviewed  CBC WITH DIFFERENTIAL/PLATELET - Abnormal; Notable for the following components:      Result Value   WBC 15.7 (*)    RBC 5.46 (*)    HCT 46.8 (*)    Neutro Abs 10.9 (*)    All other components within normal limits  COMPREHENSIVE METABOLIC PANEL - Abnormal; Notable for the following components:   Glucose, Bld 140 (*)    All other components within normal limits  TROPONIN I (HIGH SENSITIVITY)    EKG EKG Interpretation  Date/Time:  Wednesday June 19 2020 18:26:05 EDT Ventricular Rate:  123 PR Interval:  140 QRS Duration: 62 QT Interval:  300 QTC Calculation: 429 R Axis:   57 Text Interpretation: Sinus tachycardia Otherwise normal ECG No previous ECGs available Confirmed by Richardean Canal [phone]) on 06/19/2020 8:59:23 PM   Radiology DG Chest 2 View  Result Date: 06/19/2020 CLINICAL DATA:  Chest pressure and elevated blood pressure. EXAM: CHEST - 2 VIEW COMPARISON:  None. FINDINGS: There is no evidence of an acute infiltrate, pleural effusion or pneumothorax. Very mild atelectasis versus bronchovascular prominence is seen within the left lung base. The heart size and mediastinal contours are within normal limits. The visualized skeletal structures are unremarkable. IMPRESSION: No active cardiopulmonary disease. Electronically Signed   By: Aram Candela M.D.   On: 06/19/2020 19:27    Procedures Procedures (including critical care time)  Medications Ordered in ED Medications  hydrOXYzine (ATARAX/VISTARIL) tablet 25 mg (has no administration in time range)    ED Course  I have reviewed the triage vital signs and the nursing notes.  Pertinent labs & imaging results that were available during my care  of the patient were reviewed by me and considered in my medical decision making (see chart for details).    MDM Rules/Calculators/A&P                          Adriana Thompson is a 44 y.o. female here presenting with tachycardia and some pain.  Symptoms since yesterday.  Patient did  take 1 dose of Cymbalta and did not tolerated.  Patient has well-documented tachycardia that has been worked up previously.  Patient is on atenolol ready.  I think her symptoms are likely secondary to anxiety and stress.  Consider PE but she has known tachycardia and she has no travel or history of DVT or PE.  Her troponin is negative and her chest x-ray is unremarkable.  This point will increase her atenolol to 50 mg twice daily.  She requested something for anxiety so I will try some hydroxyzine.  Told her to follow-up with her primary care doctor.  If she has worsening symptoms then she can return to the ED for further evaluation.  Final Clinical Impression(s) / ED Diagnoses Final diagnoses:  None    Rx / DC Orders ED Discharge Orders    None       Charlynne Pander, MD 06/19/20 2119

## 2020-06-19 NOTE — Discharge Instructions (Signed)
Please increase atenolol to 50 mg twice daily.  Take hydroxyzine twice daily as needed for anxiety.  Your heart rate is elevated so please get it rechecked with your doctor in 2 to 3 days.  See your doctor for follow-up  Return to ER if you have worse chest pain or shortness of breath or anxiety or dizziness or palpitations.

## 2020-06-19 NOTE — ED Notes (Signed)
Patient transported to X-ray 

## 2020-06-19 NOTE — ED Triage Notes (Signed)
Pt c/o left sided chest pain x 30 mins, increased anxiety, started on cymbalta x 1 day ago and states only one dose and he made her dizzy

## 2020-10-03 ENCOUNTER — Other Ambulatory Visit: Payer: Self-pay

## 2020-10-03 ENCOUNTER — Ambulatory Visit (HOSPITAL_COMMUNITY): Payer: No Typology Code available for payment source | Admitting: Licensed Clinical Social Worker

## 2020-11-12 ENCOUNTER — Ambulatory Visit (INDEPENDENT_AMBULATORY_CARE_PROVIDER_SITE_OTHER): Payer: No Typology Code available for payment source | Admitting: Licensed Clinical Social Worker

## 2020-11-12 DIAGNOSIS — F411 Generalized anxiety disorder: Secondary | ICD-10-CM
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
## 2020-12-19 ENCOUNTER — Ambulatory Visit (INDEPENDENT_AMBULATORY_CARE_PROVIDER_SITE_OTHER): Payer: No Typology Code available for payment source | Admitting: Licensed Clinical Social Worker

## 2020-12-19 DIAGNOSIS — F411 Generalized anxiety disorder: Secondary | ICD-10-CM

## 2020-12-19 NOTE — Progress Notes (Signed)
Therapist contacted patient by text and she did not respond. Session is a no show 

## 2021-03-12 ENCOUNTER — Other Ambulatory Visit: Payer: Self-pay | Admitting: *Deleted

## 2021-03-12 DIAGNOSIS — G5603 Carpal tunnel syndrome, bilateral upper limbs: Secondary | ICD-10-CM

## 2021-03-12 MED ORDER — MELOXICAM 15 MG PO TABS: ORAL_TABLET | ORAL | 0 refills | Status: DC

## 2021-03-19 ENCOUNTER — Ambulatory Visit: Payer: Self-pay

## 2021-03-19 ENCOUNTER — Encounter: Payer: Self-pay | Admitting: Family Medicine

## 2021-03-19 ENCOUNTER — Other Ambulatory Visit: Payer: Self-pay

## 2021-03-19 ENCOUNTER — Ambulatory Visit (INDEPENDENT_AMBULATORY_CARE_PROVIDER_SITE_OTHER): Payer: No Typology Code available for payment source | Admitting: Family Medicine

## 2021-03-19 VITALS — BP 110/68 | Ht 62.0 in | Wt 158.0 lb

## 2021-03-19 DIAGNOSIS — G5603 Carpal tunnel syndrome, bilateral upper limbs: Secondary | ICD-10-CM

## 2021-03-19 DIAGNOSIS — M25531 Pain in right wrist: Secondary | ICD-10-CM | POA: Diagnosis not present

## 2021-03-19 DIAGNOSIS — G5601 Carpal tunnel syndrome, right upper limb: Secondary | ICD-10-CM

## 2021-03-19 DIAGNOSIS — M25532 Pain in left wrist: Secondary | ICD-10-CM

## 2021-03-19 MED ORDER — CELECOXIB 200 MG PO CAPS: ORAL_CAPSULE | ORAL | 2 refills | Status: AC

## 2021-03-19 MED ORDER — METHYLPREDNISOLONE ACETATE 40 MG/ML IJ SUSP
40.0000 mg | Freq: Once | INTRAMUSCULAR | Status: AC
  Administered 2021-03-19: 12:00:00 40 mg via INTRA_ARTICULAR

## 2021-03-19 NOTE — Patient Instructions (Signed)
Good to see you Please try the celebrex  Please look into the Sonex procedure  Please send me a message in MyChart with any questions or updates.  Please see me back in 4 weeks.   --Dr. Jordan Likes

## 2021-03-19 NOTE — Assessment & Plan Note (Signed)
Acute on chronic in nature.  Seems to be worse with time she does not work. -Counseled on home exercise therapy and supportive care. -Bilateral injections today. -Counseled on bracing. -Counseled on Sonex procedure.

## 2021-03-19 NOTE — Progress Notes (Signed)
Adriana Thompson - 45 y.o. female MRN [id number]  Date of birth: 1976-02-21  SUBJECTIVE:  Including CC & ROS.  No chief complaint on file.   Adriana Thompson is a 45 y.o. female that is presenting with acute on chronic bilateral carpal tunnel.  It is worsened by the time she do not work.  Has been trying braces.  Injections have helped in the past.   Review of Systems See HPI   HISTORY: Past Medical, Surgical, Social, and Family History Reviewed & Updated per EMR.   Pertinent Historical Findings include:  Past Medical History:  Diagnosis Date   Anxiety    GERD (gastroesophageal reflux disease)    Tachycardia     Past Surgical History:  Procedure Laterality Date   CESAREAN SECTION      History reviewed. No pertinent family history.  Social History   Socioeconomic History   Marital status: Married    Spouse name: Not on file   Number of children: Not on file   Years of education: Not on file   Highest education level: Not on file  Occupational History   Not on file  Tobacco Use   Smoking status: Never   Smokeless tobacco: Never  Substance and Sexual Activity   Alcohol use: Not Currently   Drug use: Not Currently   Sexual activity: Not on file  Other Topics Concern   Not on file  Social History Narrative   Not on file   Social Determinants of Health   Financial Resource Strain: Not on file  Food Insecurity: Not on file  Transportation Needs: Not on file  Physical Activity: Not on file  Stress: Not on file  Social Connections: Not on file  Intimate Partner Violence: Not on file     PHYSICAL EXAM:  VS: BP 110/68 (BP Location: Left Arm, Patient Position: Sitting, Cuff Size: Normal)   Ht 5\' 2"  (1.575 m)   Wt 158 lb (71.7 kg)   BMI 28.90 kg/m  Physical Exam Gen: NAD, alert, cooperative with exam, well-appearing MSK:  Right and left hand: Normal range of motion. Normal strength resistance. No signs of atrophy. Neurovascular intact  Limited ultrasound:  Right and left wrist:  The median nerve on the right and left wrist measures fairly normal.  No flattening or swelling  Summary: No significant structural changes of the median nerve bilaterally.  Ultrasound and interpretation by , MD   Aspiration/Injection Procedure Note Adriana Thompson [birth year]  Procedure: Injection Indications: Left carpal tunnel  Procedure Details Consent: Risks of procedure as well as the alternatives and risks of each were explained to the (patient/caregiver).  Consent for procedure obtained. Time Out: Verified patient identification, verified procedure, site/side was marked, verified correct patient position, special equipment/implants available, medications/allergies/relevent history reviewed, required imaging and test results available.  Performed.  The area was cleaned with iodine and alcohol swabs.    The left carpal tunnel was injected using 1 cc's of 40 mg Depo-Medrol and 1 cc's of 0.25% bupivacaine with a 25 1 1/2" needle.  Ultrasound was used. Images were obtained in long views showing the injection.     A sterile dressing was applied.  Patient did tolerate procedure well.  Aspiration/Injection Procedure Note Adriana Thompson [birth year]  Procedure: Injection Indications: right carpal tunnel  Procedure Details Consent: Risks of procedure as well as the alternatives and risks of each were explained to the (patient/caregiver).  Consent for procedure obtained. Time Out: Verified patient identification, verified procedure, site/side was marked, verified correct  patient position, special equipment/implants available, medications/allergies/relevent history reviewed, required imaging and test results available.  Performed.  The area was cleaned with iodine and alcohol swabs.    The right carpal tunnel was injected using 1 cc's of 40 mg Depo-Medrol and 1 cc's of 0.25% bupivacaine with a 25 1 1/2" needle.  Ultrasound was used. Images were  obtained in long views showing the injection.     A sterile dressing was applied.  Patient did tolerate procedure well.  ASSESSMENT & PLAN:   Bilateral carpal tunnel syndrome Acute on chronic in nature.  Seems to be worse with time she does not work. -Counseled on home exercise therapy and supportive care. -Bilateral injections today. -Counseled on bracing. -Counseled on Sonex procedure.

## 2021-03-25 DIAGNOSIS — M25531 Pain in right wrist: Secondary | ICD-10-CM | POA: Insufficient documentation

## 2021-03-25 DIAGNOSIS — G5600 Carpal tunnel syndrome, unspecified upper limb: Secondary | ICD-10-CM | POA: Insufficient documentation

## 2021-03-25 DIAGNOSIS — M25532 Pain in left wrist: Secondary | ICD-10-CM | POA: Insufficient documentation

## 2022-04-01 IMAGING — DX DG CHEST 2V
2 series · 2 of 2 positions shown · non-contrast
Comparison: None.

CLINICAL DATA: Chest pressure and elevated blood pressure.

EXAM:
CHEST - 2 VIEW

[chest pa]
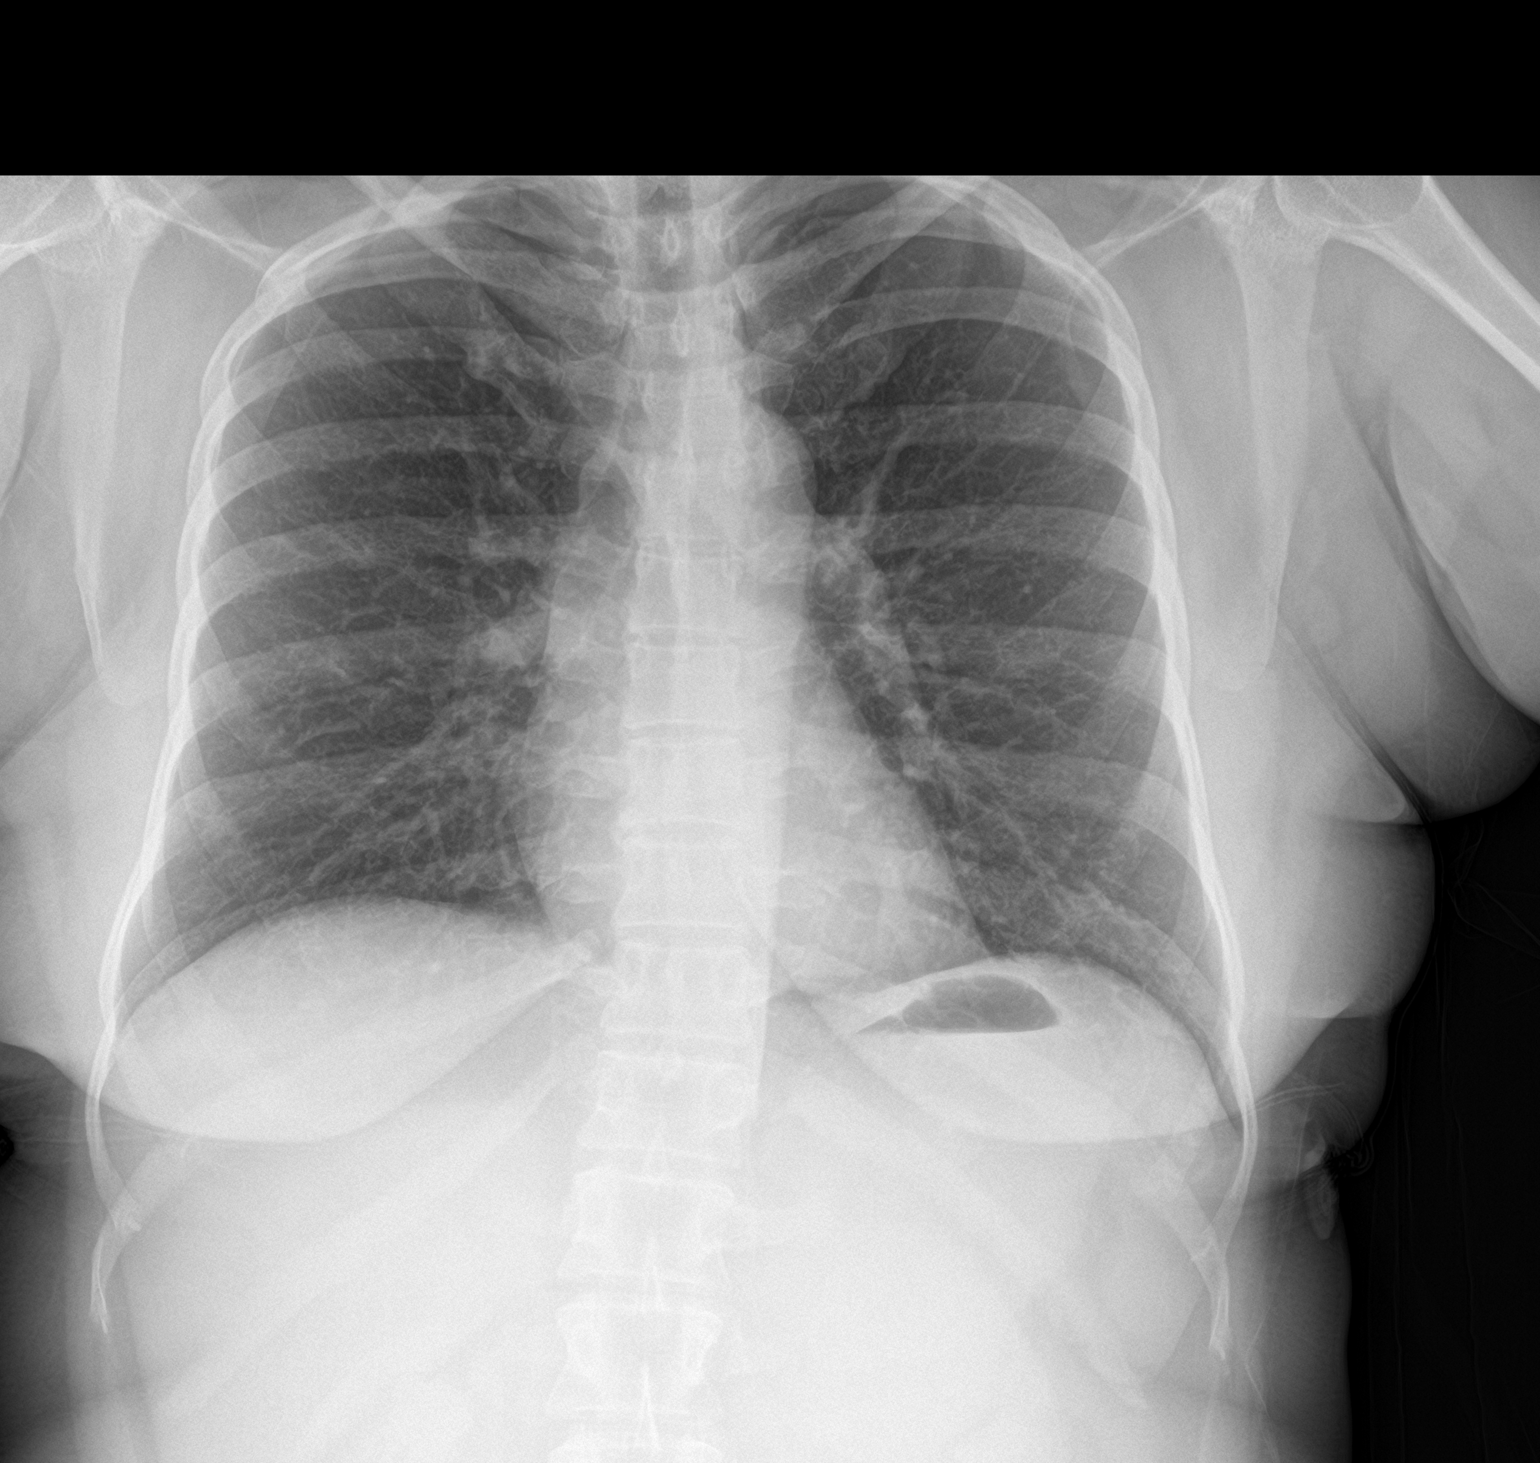

[chest lat]
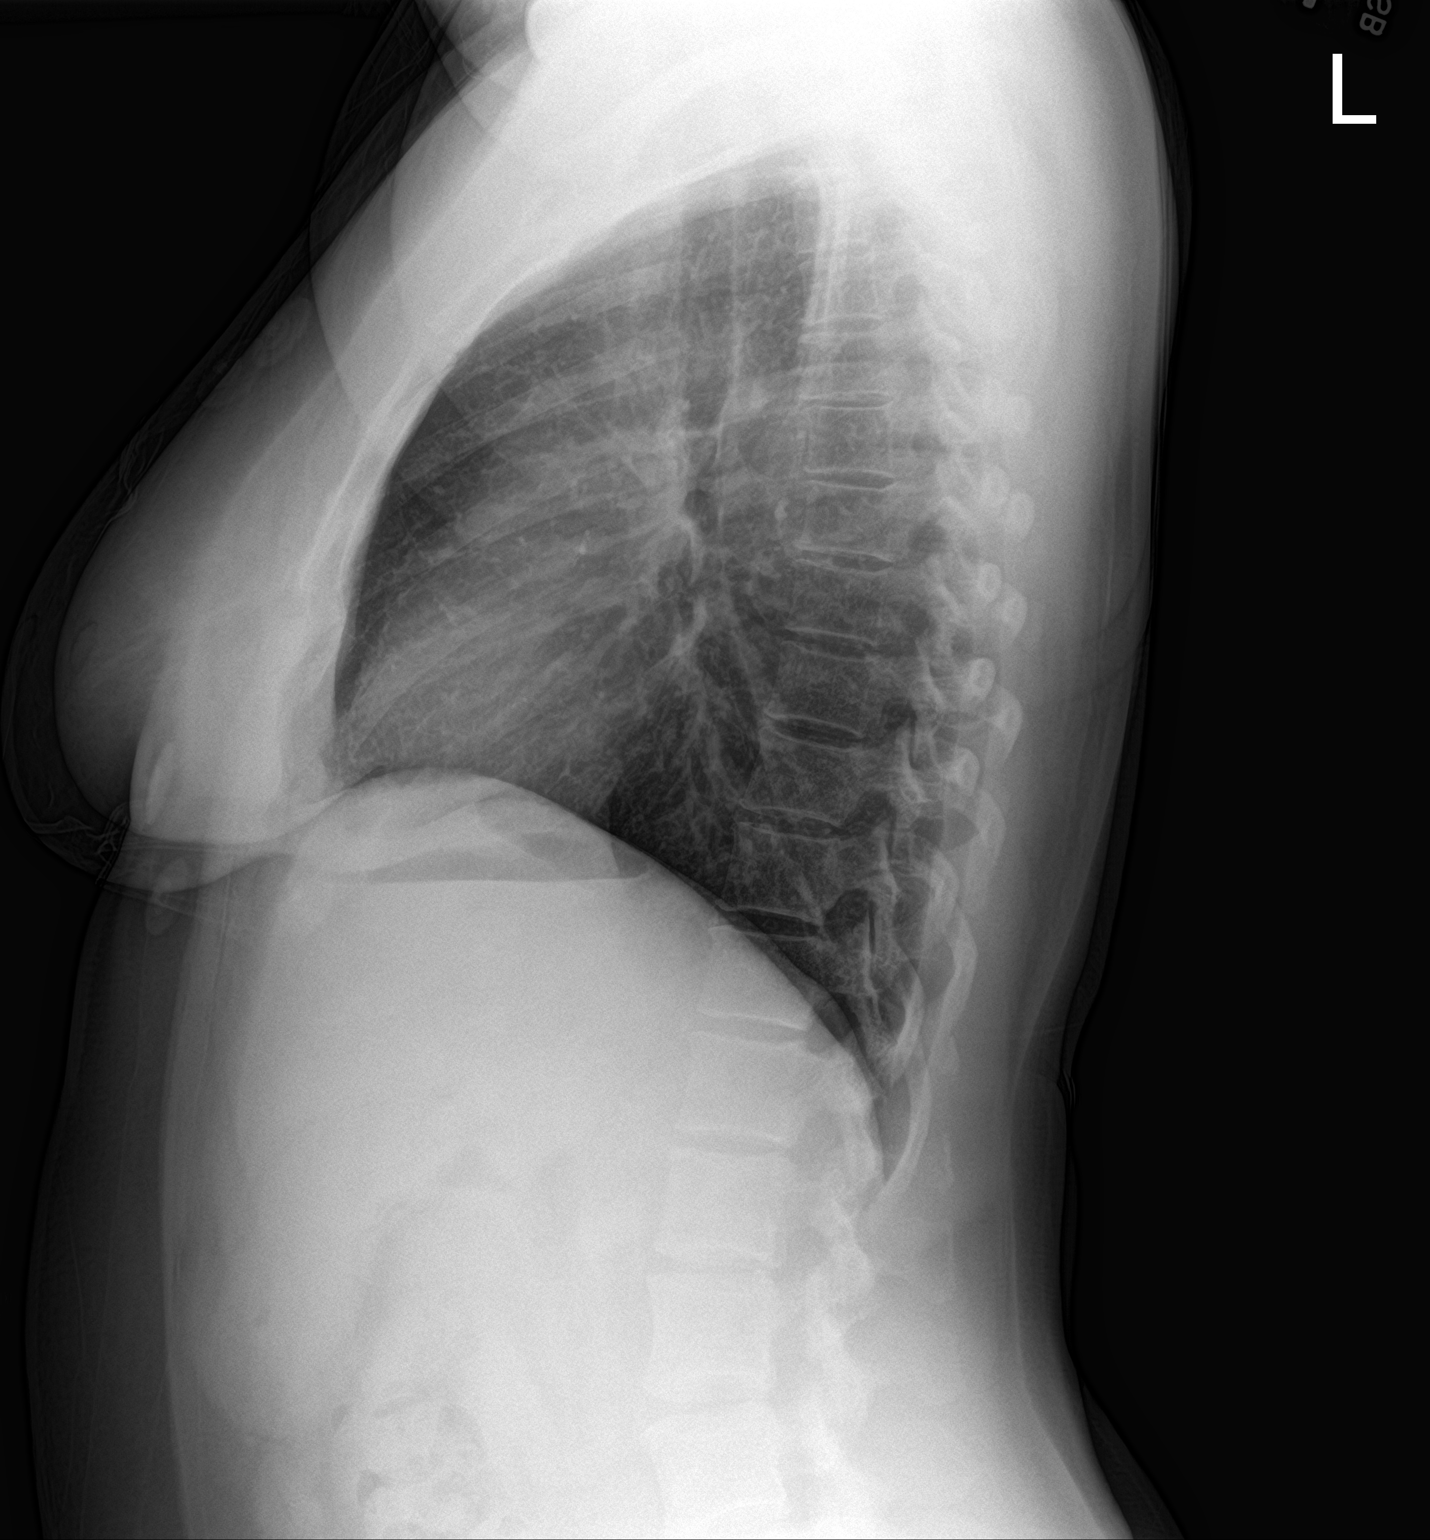

[2 of 2 positions shown; findings below may reference images not displayed]

FINDINGS: There is no evidence of an acute infiltrate, pleural effusion or
pneumothorax. Very mild atelectasis versus bronchovascular
prominence is seen within the left lung base. The heart size and
mediastinal contours are within normal limits. The visualized
skeletal structures are unremarkable.
IMPRESSION: No active cardiopulmonary disease.

## 2022-08-04 ENCOUNTER — Telehealth (HOSPITAL_BASED_OUTPATIENT_CLINIC_OR_DEPARTMENT_OTHER): Payer: Self-pay | Admitting: Obstetrics & Gynecology

## 2022-08-04 NOTE — Telephone Encounter (Signed)
Called patient X 2 for consult for Sonata  and was unable to leave message voice email is full.

## 2023-01-04 ENCOUNTER — Encounter: Payer: Self-pay | Admitting: *Deleted

## 2023-02-24 ENCOUNTER — Telehealth (HOSPITAL_COMMUNITY): Payer: Self-pay | Admitting: *Deleted

## 2023-02-24 NOTE — Telephone Encounter (Signed)
CHECKING MED LIST
